# Patient Record
Sex: Female | Born: 1995
Health system: Southern US, Community
[De-identification: ages and names within clinical notes are randomized; demographics above are authoritative.]

## PROBLEM LIST (undated history)

## (undated) ENCOUNTER — Emergency Department (HOSPITAL_BASED_OUTPATIENT_CLINIC_OR_DEPARTMENT_OTHER): Admission: EM | Payer: Medicaid Other | Source: Home / Self Care

## (undated) ENCOUNTER — Emergency Department (HOSPITAL_BASED_OUTPATIENT_CLINIC_OR_DEPARTMENT_OTHER): Payer: Medicaid Other | Source: Home / Self Care

## (undated) DIAGNOSIS — F319 Bipolar disorder, unspecified: Secondary | ICD-10-CM

## (undated) DIAGNOSIS — O24419 Gestational diabetes mellitus in pregnancy, unspecified control: Secondary | ICD-10-CM

## (undated) DIAGNOSIS — F429 Obsessive-compulsive disorder, unspecified: Secondary | ICD-10-CM

## (undated) HISTORY — PX: INDUCED ABORTION: SHX677

---

## 2013-03-31 ENCOUNTER — Emergency Department (HOSPITAL_BASED_OUTPATIENT_CLINIC_OR_DEPARTMENT_OTHER)
Admission: EM | Admit: 2013-03-31 | Discharge: 2013-03-31 | Disposition: A | Discharge status: Home / Self Care | Payer: Medicaid Other | Attending: Emergency Medicine | Admitting: Emergency Medicine

## 2013-03-31 ENCOUNTER — Encounter (HOSPITAL_BASED_OUTPATIENT_CLINIC_OR_DEPARTMENT_OTHER): Payer: Self-pay | Admitting: Emergency Medicine

## 2013-03-31 ENCOUNTER — Emergency Department (HOSPITAL_BASED_OUTPATIENT_CLINIC_OR_DEPARTMENT_OTHER): Payer: Medicaid Other

## 2013-03-31 DIAGNOSIS — Z8632 Personal history of gestational diabetes: Secondary | ICD-10-CM | POA: Insufficient documentation

## 2013-03-31 DIAGNOSIS — F17200 Nicotine dependence, unspecified, uncomplicated: Secondary | ICD-10-CM | POA: Insufficient documentation

## 2013-03-31 DIAGNOSIS — R200 Anesthesia of skin: Secondary | ICD-10-CM

## 2013-03-31 DIAGNOSIS — R209 Unspecified disturbances of skin sensation: Secondary | ICD-10-CM | POA: Insufficient documentation

## 2013-03-31 HISTORY — DX: Gestational diabetes mellitus in pregnancy, unspecified control: O24.419

## 2013-03-31 LAB — CBC WITH DIFFERENTIAL/PLATELET
BASOS PCT: 0 % (ref 0–1)
Basophils Absolute: 0 10*3/uL (ref 0.0–0.1)
Eosinophils Absolute: 0.2 10*3/uL (ref 0.0–0.7)
Eosinophils Relative: 2 % (ref 0–5)
HEMATOCRIT: 38.7 % (ref 36.0–46.0)
HEMOGLOBIN: 13.2 g/dL (ref 12.0–15.0)
LYMPHS ABS: 3.2 10*3/uL (ref 0.7–4.0)
Lymphocytes Relative: 40 % (ref 12–46)
MCH: 28.4 pg (ref 26.0–34.0)
MCHC: 34.1 g/dL (ref 30.0–36.0)
MCV: 83.4 fL (ref 78.0–100.0)
MONO ABS: 0.6 10*3/uL (ref 0.1–1.0)
MONOS PCT: 8 % (ref 3–12)
NEUTROS PCT: 49 % (ref 43–77)
Neutro Abs: 3.9 10*3/uL (ref 1.7–7.7)
Platelets: 258 10*3/uL (ref 150–400)
RBC: 4.64 MIL/uL (ref 3.87–5.11)
RDW: 14.6 % (ref 11.5–15.5)
WBC: 7.9 10*3/uL (ref 4.0–10.5)

## 2013-03-31 LAB — BASIC METABOLIC PANEL
BUN: 13 mg/dL (ref 6–23)
CO2: 25 mEq/L (ref 19–32)
CREATININE: 0.7 mg/dL (ref 0.50–1.10)
Calcium: 9.8 mg/dL (ref 8.4–10.5)
Chloride: 100 mEq/L (ref 96–112)
GFR calc non Af Amer: >90 mL/min (ref >90)
GLUCOSE: 88 mg/dL (ref 70–99)
POTASSIUM: 4.1 meq/L (ref 3.7–5.3)
Sodium: 137 mEq/L (ref 137–147)

## 2013-03-31 NOTE — Discharge Instructions (Signed)
MRI as scheduled. Follow the results up with your primary Dr.  Return to the ER if your symptoms substantially worsen or change.   Paresthesia Paresthesia is an abnormal burning or prickling sensation. This sensation is generally felt in the hands, arms, legs, or feet. However, it may occur in any part of the body. It is usually not painful. The feeling may be described as:  Tingling or numbness.  "Pins and needles."  Skin crawling.  Buzzing.  Limbs "falling asleep."  Itching. Most people experience temporary (transient) paresthesia at some time in their lives. CAUSES  Paresthesia may occur when you breathe too quickly (hyperventilation). It can also occur without any apparent cause. Commonly, paresthesia occurs when pressure is placed on a nerve. The feeling quickly goes away once the pressure is removed. For some people, however, paresthesia is a long-lasting (chronic) condition caused by an underlying disorder. The underlying disorder may be:  A traumatic, direct injury to nerves. Examples include a:  Broken (fractured) neck.  Fractured skull.  A disorder affecting the brain and spinal cord (central nervous system). Examples include:  Transverse myelitis.  Encephalitis.  Transient ischemic attack.  Multiple sclerosis.  Stroke.  Tumor or blood vessel problems, such as an arteriovenous malformation pressing against the brain or spinal cord.  A condition that damages the peripheral nerves (peripheral neuropathy). Peripheral nerves are not part of the brain and spinal cord. These conditions include:  Diabetes.  Peripheral vascular disease.  Nerve entrapment syndromes, such as carpal tunnel syndrome.  Shingles.  Hypothyroidism.  Vitamin B12 deficiencies.  Alcoholism.  Heavy metal poisoning (lead, arsenic).  Rheumatoid arthritis.  Systemic lupus erythematosus. DIAGNOSIS  Your caregiver will attempt to find the underlying cause of your paresthesia. Your  caregiver may:  Take your medical history.  Perform a physical exam.  Order various lab tests.  Order imaging tests. TREATMENT  Treatment for paresthesia depends on the underlying cause. HOME CARE INSTRUCTIONS  Avoid drinking alcohol.  You may consider massage or acupuncture to help relieve your symptoms.  Keep all follow-up appointments as directed by your caregiver. SEEK IMMEDIATE MEDICAL CARE IF:   You feel weak.  You have trouble walking or moving.  You have problems with speech or vision.  You feel confused.  You cannot control your bladder or bowel movements.  You feel numbness after an injury.  You faint.  Your burning or prickling feeling gets worse when walking.  You have pain, cramps, or dizziness.  You develop a rash. MAKE SURE YOU:  Understand these instructions.  Will watch your condition.  Will get help right away if you are not doing well or get worse. Document Released: 01/17/2002 Document Revised: 04/21/2011 Document Reviewed: 10/18/2010 [facility] Patient Information 2014 [place]ExitCare, [place]LLC.

## 2013-03-31 NOTE — ED Notes (Signed)
Pt c/o numbness from waist  Down x 2 days,  Denies pain  ? Feet swelling at times

## 2013-03-31 NOTE — ED Provider Notes (Signed)
CSN: [id number]     Arrival date & time 03/31/13  1807 History   This chart was scribed for Geoffery Lyons, MD by Ladona Ridgel Day, ED scribe. This patient was seen in room MH01/MH01 and the patient's care was started at 1807.  Chief Complaint  Patient presents with  . Numbness    bilateral lower legs.   The history is provided by the patient. No language interpreter was used.   HPI Comments: Aissa Lisowski is a 18 y.o. female who presents to the Emergency Department complaining of bilateral paraesthesias over her upper lateral thighs extending down to her lower legs, onset 2 days ago; denies any recent falls or trauma. She denies previous similar episodes or hx of back issues. She reports it feels like "an epidural wearing off", she had an epidural for her pregnancy x4 months ago. She denies neck/back pain, HA, bowel or urinary incontinence. She reports normal strength and no difficulty ambulating.   No PCP  Past Medical History  Diagnosis Date  . Gestational diabetes    History reviewed. No pertinent past surgical history. No family history on file. History  Substance Use Topics  . Smoking status: Current Every Day Smoker    Types: Cigarettes  . Smokeless tobacco: Not on file  . Alcohol Use: No   OB History   Grav Para Term Preterm Abortions TAB SAB Ect Mult Living                 Review of Systems  Constitutional: Negative for fever and chills.  Respiratory: Negative for cough and shortness of breath.   Cardiovascular: Negative for chest pain.  Gastrointestinal: Negative for abdominal pain.  Musculoskeletal: Negative for back pain.  Neurological: Positive for numbness.  All other systems reviewed and are negative.   Allergies  Review of patient's allergies indicates no known allergies.  Home Medications   Current Outpatient Rx  Name  Route  Sig  Dispense  Refill  . Norethindrone (JENCYCLA PO)   Oral   Take by mouth.          Triage Vitals: BP 123/90  Pulse 69   Temp(Src) 98.9 F (37.2 C) (Oral)  Resp 18  SpO2 100%  LMP 03/15/2013  Physical Exam  Nursing note and vitals reviewed. Constitutional: She is oriented to person, place, and time. She appears well-developed and well-nourished. No distress.  HENT:  Head: Normocephalic and atraumatic.  Eyes: Conjunctivae are normal. Right eye exhibits no discharge. Left eye exhibits no discharge.  Neck: Normal range of motion.  Cardiovascular: Normal rate.   Pulmonary/Chest: Effort normal. No respiratory distress.  Musculoskeletal: Normal range of motion. She exhibits no edema.  Neurological: She is alert and oriented to person, place, and time. She exhibits normal muscle tone. Coordination normal.  Gait normal with no abnormalities. Normal motor strength and sensation intact. DTRs are 1+ and equal. She is able to walk on heels and toes w/out difficulty.   Skin: Skin is warm and dry.  Psychiatric: She has a normal mood and affect. Thought content normal.   ED Course  Procedures (including critical care time) DIAGNOSTIC STUDIES: Oxygen Saturation is 100% on room air, normal by my interpretation.    COORDINATION OF CARE: At 21 PM Discussed treatment plan with patient which includes lumbar spine X-ray, blood work. Patient agrees.   Labs Review Labs Reviewed  CBC WITH DIFFERENTIAL  BASIC METABOLIC PANEL   Imaging Review No results found.   MDM   Final diagnoses:  None  Patient is a 18 year old female who presents with complaints of numbness in her legs. She has no bowel or bladder complaints and denies any injury or trauma. She has a history of multiple sclerosis within her family and is concerned that this could be the problem. Workup reveals normal labs and lumbar spine films. Due to the nature of complaints and her family history I will order MRI of the brain, cervical spine, and lumbar spine. This will be performed as an outpatient as there is no MRI here this evening to perform  this.   I personally performed the services described in this documentation, which was scribed in my presence. The recorded information has been reviewed and is accurate.     Geoffery Lyonsouglas Fabiha Rougeau, MD 03/31/13 2100

## 2013-03-31 NOTE — ED Notes (Signed)
Patient states she has bilateral lower leg numbness for the last two days.  Denies any injury.  Pt ambulates with no problem or distress and was able to work today with no problems.

## 2013-04-02 NOTE — ED Provider Notes (Signed)
8:53 AM Patient presents for outpt MRI ordered by Dr. Judd Lienelo several days ago d/t paresthesias in LE's. We have MRI here now, but orders not put in the system correctly for some reason and pt will have to check in for me to put the orders back in. She does not want to wait and would prefer to get the MRI on Monday. I will place the orders for the MRI so she can get pre-certified (by her insurance). She is happy with this. This was arranged by the radiology techs and I did not meet or examine the patient.   Junius ArgyleForrest S Lorenda Grecco, MD 04/02/13 [phone]

## 2013-12-14 ENCOUNTER — Emergency Department (HOSPITAL_BASED_OUTPATIENT_CLINIC_OR_DEPARTMENT_OTHER)
Admission: EM | Admit: 2013-12-14 | Discharge: 2013-12-14 | Disposition: A | Discharge status: Home / Self Care | Payer: Medicaid Other | Attending: Emergency Medicine | Admitting: Emergency Medicine

## 2013-12-14 ENCOUNTER — Emergency Department (HOSPITAL_BASED_OUTPATIENT_CLINIC_OR_DEPARTMENT_OTHER): Payer: Medicaid Other

## 2013-12-14 ENCOUNTER — Encounter (HOSPITAL_BASED_OUTPATIENT_CLINIC_OR_DEPARTMENT_OTHER): Payer: Self-pay

## 2013-12-14 DIAGNOSIS — N390 Urinary tract infection, site not specified: Secondary | ICD-10-CM

## 2013-12-14 DIAGNOSIS — F319 Bipolar disorder, unspecified: Secondary | ICD-10-CM | POA: Diagnosis not present

## 2013-12-14 DIAGNOSIS — R0789 Other chest pain: Secondary | ICD-10-CM | POA: Diagnosis not present

## 2013-12-14 DIAGNOSIS — Z720 Tobacco use: Secondary | ICD-10-CM | POA: Insufficient documentation

## 2013-12-14 DIAGNOSIS — R0602 Shortness of breath: Secondary | ICD-10-CM | POA: Diagnosis not present

## 2013-12-14 DIAGNOSIS — Z8632 Personal history of gestational diabetes: Secondary | ICD-10-CM | POA: Diagnosis not present

## 2013-12-14 DIAGNOSIS — Z3202 Encounter for pregnancy test, result negative: Secondary | ICD-10-CM | POA: Insufficient documentation

## 2013-12-14 DIAGNOSIS — R079 Chest pain, unspecified: Secondary | ICD-10-CM | POA: Diagnosis present

## 2013-12-14 HISTORY — DX: Bipolar disorder, unspecified: F31.9

## 2013-12-14 HISTORY — DX: Obsessive-compulsive disorder, unspecified: F42.9

## 2013-12-14 LAB — BASIC METABOLIC PANEL
Anion gap: 12 (ref 5–15)
BUN: 11 mg/dL (ref 6–23)
CO2: 25 meq/L (ref 19–32)
CREATININE: 0.7 mg/dL (ref 0.50–1.10)
Calcium: 9.4 mg/dL (ref 8.4–10.5)
Chloride: 103 mEq/L (ref 96–112)
GFR calc Af Amer: >90 mL/min (ref >90)
GFR calc non Af Amer: >90 mL/min (ref >90)
Glucose, Bld: 91 mg/dL (ref 70–99)
Potassium: 3.8 mEq/L (ref 3.7–5.3)
SODIUM: 140 meq/L (ref 137–147)

## 2013-12-14 LAB — CBC WITH DIFFERENTIAL/PLATELET
Basophils Absolute: 0 10*3/uL (ref 0.0–0.1)
Basophils Relative: 0 % (ref 0–1)
EOS PCT: 2 % (ref 0–5)
Eosinophils Absolute: 0.1 10*3/uL (ref 0.0–0.7)
HEMATOCRIT: 39.7 % (ref 36.0–46.0)
Hemoglobin: 13.5 g/dL (ref 12.0–15.0)
LYMPHS ABS: 3.4 10*3/uL (ref 0.7–4.0)
LYMPHS PCT: 56 % — AB (ref 12–46)
MCH: 28.7 pg (ref 26.0–34.0)
MCHC: 34 g/dL (ref 30.0–36.0)
MCV: 84.3 fL (ref 78.0–100.0)
MONO ABS: 0.6 10*3/uL (ref 0.1–1.0)
MONOS PCT: 10 % (ref 3–12)
Neutro Abs: 1.9 10*3/uL (ref 1.7–7.7)
Neutrophils Relative %: 32 % — ABNORMAL LOW (ref 43–77)
Platelets: 268 10*3/uL (ref 150–400)
RBC: 4.71 MIL/uL (ref 3.87–5.11)
RDW: 14.2 % (ref 11.5–15.5)
WBC: 6.1 10*3/uL (ref 4.0–10.5)

## 2013-12-14 LAB — URINALYSIS, ROUTINE W REFLEX MICROSCOPIC
Glucose, UA: NEGATIVE mg/dL
KETONES UR: 15 mg/dL — AB
Nitrite: POSITIVE — AB
Protein, ur: NEGATIVE mg/dL
SPECIFIC GRAVITY, URINE: 1.035 — AB (ref 1.005–1.030)
Urobilinogen, UA: 1 mg/dL (ref 0.0–1.0)
pH: 6 (ref 5.0–8.0)

## 2013-12-14 LAB — URINE MICROSCOPIC-ADD ON

## 2013-12-14 LAB — PREGNANCY, URINE: Preg Test, Ur: NEGATIVE

## 2013-12-14 LAB — D-DIMER, QUANTITATIVE (NOT AT ARMC)

## 2013-12-14 MED ORDER — CEPHALEXIN 500 MG PO CAPS: 500.0000 mg | ORAL_CAPSULE | Freq: Two times a day (BID) | ORAL | Status: DC

## 2013-12-14 NOTE — Discharge Instructions (Signed)
Chest Pain (Nonspecific) It is often hard to give a diagnosis for the cause of chest pain. There is always a chance that your pain could be related to something serious, such as a heart attack or a blood clot in the lungs. You need to follow up with your doctor. HOME CARE  If antibiotic medicine was given, take it as directed by your doctor. Finish the medicine even if you start to feel better.  For the next few days, avoid activities that bring on chest pain. Continue physical activities as told by your doctor.  Do not use any tobacco products. This includes cigarettes, chewing tobacco, and e-cigarettes.  Avoid drinking alcohol.  Only take medicine as told by your doctor.  Follow your doctor's suggestions for more testing if your chest pain does not go away.  Keep all doctor visits you made. GET HELP IF:  Your chest pain does not go away, even after treatment.  You have a rash with blisters on your chest.  You have a fever. GET HELP RIGHT AWAY IF:   You have more pain or pain that spreads to your arm, neck, jaw, back, or belly (abdomen).  You have shortness of breath.  You cough more than usual or cough up blood.  You have very bad back or belly pain.  You feel sick to your stomach (nauseous) or throw up (vomit).  You have very bad weakness.  You pass out (faint).  You have chills. This is an emergency. Do not wait to see if the problems will go away. Call your local emergency services (911 in [place] Do not drive yourself to the hospital. MAKE SURE YOU:   Understand these instructions.  Will watch your condition.  Will get help right away if you are not doing well or get worse. Document Released: 07/16/2007 Document Revised: 02/01/2013 Document Reviewed: 07/16/2007 [facility] Patient Information 2015 [place]ExitCare, [place]LLC. This information is not intended to replace advice given to you by your health care provider. Make sure you discuss any questions you have with your  health care provider.  Urinary Tract Infection Urinary tract infections (UTIs) can develop anywhere along your urinary tract. Your urinary tract is your body's drainage system for removing wastes and extra water. Your urinary tract includes two kidneys, two ureters, a bladder, and a urethra. Your kidneys are a pair of bean-shaped organs. Each kidney is about the size of your fist. They are located below your ribs, one on each side of your spine. CAUSES Infections are caused by microbes, which are microscopic organisms, including fungi, viruses, and bacteria. These organisms are so small that they can only be seen through a microscope. Bacteria are the microbes that most commonly cause UTIs. SYMPTOMS  Symptoms of UTIs may vary by age and gender of the patient and by the location of the infection. Symptoms in young women typically include a frequent and intense urge to urinate and a painful, burning feeling in the bladder or urethra during urination. Older women and men are more likely to be tired, shaky, and weak and have muscle aches and abdominal pain. A fever may mean the infection is in your kidneys. Other symptoms of a kidney infection include pain in your back or sides below the ribs, nausea, and vomiting. DIAGNOSIS To diagnose a UTI, your caregiver will ask you about your symptoms. Your caregiver also will ask to provide a urine sample. The urine sample will be tested for bacteria and white blood cells. White blood cells are made  by your body to help fight infection. TREATMENT  Typically, UTIs can be treated with medication. Because most UTIs are caused by a bacterial infection, they usually can be treated with the use of antibiotics. The choice of antibiotic and length of treatment depend on your symptoms and the type of bacteria causing your infection. HOME CARE INSTRUCTIONS  If you were prescribed antibiotics, take them exactly as your caregiver instructs you. Finish the medication even if you  feel better after you have only taken some of the medication.  Drink enough water and fluids to keep your urine clear or pale yellow.  Avoid caffeine, tea, and carbonated beverages. They tend to irritate your bladder.  Empty your bladder often. Avoid holding urine for long periods of time.  Empty your bladder before and after sexual intercourse.  After a bowel movement, women should cleanse from front to back. Use each tissue only once. SEEK MEDICAL CARE IF:   You have back pain.  You develop a fever.  Your symptoms do not begin to resolve within 3 days. SEEK IMMEDIATE MEDICAL CARE IF:   You have severe back pain or lower abdominal pain.  You develop chills.  You have nausea or vomiting.  You have continued burning or discomfort with urination. MAKE SURE YOU:   Understand these instructions.  Will watch your condition.  Will get help right away if you are not doing well or get worse. Document Released: 11/06/2004 Document Revised: 07/29/2011 Document Reviewed: 03/07/2011 [facility]Exit[facility] Patient Information 2015 [place]ExitCare, [place]LLC. This information is not intended to replace advice given to you by your health care provider. Make sure you discuss any questions you have with your health care provider.

## 2013-12-14 NOTE — ED Notes (Signed)
C/o intermittent CP x 1 month- and "my pee smells like lemons"

## 2013-12-14 NOTE — ED Provider Notes (Signed)
CSN: [id number]     Arrival date & time 12/14/13  1414 History   First MD Initiated Contact with Patient 12/14/13 1421     Chief Complaint  Patient presents with  . Chest Pain     (Consider location/radiation/quality/duration/timing/severity/associated sxs/prior Treatment) HPI Comments: Pt states that she has had an intermittently "heaviness" in her chest. She states that she feels sob and that is what causes the heaviness. Nothing makes that pain better or worse. No cough, fever, n/v, dizziness, or swelling. Hasn't tried anything for the symptoms. Does take birthcontrol pill. Also notes that her urine smells like lemons  The history is provided by the patient. No language interpreter was used.    Past Medical History  Diagnosis Date  . Gestational diabetes   . Bipolar disorder   . OCD (obsessive compulsive disorder)    History reviewed. No pertinent past surgical history. No family history on file. History  Substance Use Topics  . Smoking status: Current Every Day Smoker    Types: Cigarettes  . Smokeless tobacco: Not on file  . Alcohol Use: Yes   OB History    No data available     Review of Systems  All other systems reviewed and are negative.     Allergies  Review of patient's allergies indicates no known allergies.  Home Medications   Prior to Admission medications   Medication Sig Start Date End Date Taking? Authorizing Provider  OXCARBAZEPINE PO Take by mouth.   Yes Historical Provider, MD  Sertraline HCl (ZOLOFT PO) Take by mouth.   Yes Historical Provider, MD  Norethindrone (JENCYCLA PO) Take by mouth.    Historical Provider, MD   BP 128/88 mmHg  Pulse 67  Temp(Src) 98.7 F (37.1 C) (Oral)  Resp 18  Ht 5\' 5"  (1.651 m)  Wt 165 lb (74.844 kg)  BMI 27.46 kg/m2  SpO2 100%  LMP 12/11/2013 Physical Exam  Constitutional: She is oriented to person, place, and time. She appears well-developed and well-nourished.  HENT:  Head: Normocephalic and  atraumatic.  Cardiovascular: Normal rate and regular rhythm.   Pulmonary/Chest: Effort normal and breath sounds normal.  Chest tender to palpation on the left chest wall  Abdominal: Soft. Bowel sounds are normal. There is no tenderness.  Musculoskeletal: Normal range of motion.  Neurological: She is alert and oriented to person, place, and time.  Skin: Skin is warm and dry.  Psychiatric: She has a normal mood and affect.  Nursing note and vitals reviewed.   ED Course  Procedures (including critical care time) Labs Review Labs Reviewed  URINALYSIS, ROUTINE W REFLEX MICROSCOPIC - Abnormal; Notable for the following:    Color, Urine AMBER (*)    APPearance CLOUDY (*)    Specific Gravity, Urine 1.035 (*)    Hgb urine dipstick MODERATE (*)    Bilirubin Urine SMALL (*)    Ketones, ur 15 (*)    Nitrite POSITIVE (*)    Leukocytes, UA SMALL (*)    All other components within normal limits  CBC WITH DIFFERENTIAL - Abnormal; Notable for the following:    Neutrophils Relative % 32 (*)    Lymphocytes Relative 56 (*)    All other components within normal limits  URINE MICROSCOPIC-ADD ON - Abnormal; Notable for the following:    Squamous Epithelial / LPF FEW (*)    Bacteria, UA MANY (*)    All other components within normal limits  PREGNANCY, URINE  BASIC METABOLIC PANEL  D-DIMER, QUANTITATIVE  Imaging Review No results found.   EKG Interpretation   Date/Time:  Wednesday December 14 2013 14:31:18 EST Ventricular Rate:  68 PR Interval:  154 QRS Duration: 84 QT Interval:  404 QTC Calculation: 429 R Axis:   85 Text Interpretation:  Normal sinus rhythm with sinus arrhythmia Normal ECG  Confirmed by WARD,  DO, KRISTEN ([id number]) on 12/14/2013 2:33:30 PM      MDM   Final diagnoses:  SOB (shortness of breath)  Other chest pain  UTI (lower urinary tract infection)    Will treat pt for simple uti. Pt is not pregnant ad not having fever. Doubt acs for cp. D dimer is  negative.considered pe as pt is on birth control pills    Teressa LowerVrinda Peighton Mehra, NP 12/14/13 1608  Layla MawKristen N Ward, DO 12/15/13 [phone]

## 2013-12-27 ENCOUNTER — Ambulatory Visit: Payer: Medicaid Other | Admitting: Cardiology

## 2015-05-17 ENCOUNTER — Emergency Department (HOSPITAL_BASED_OUTPATIENT_CLINIC_OR_DEPARTMENT_OTHER)
Admission: EM | Admit: 2015-05-17 | Discharge: 2015-05-17 | Disposition: A | Discharge status: Home / Self Care | Payer: Medicaid Other | Attending: Emergency Medicine | Admitting: Emergency Medicine

## 2015-05-17 ENCOUNTER — Encounter (HOSPITAL_BASED_OUTPATIENT_CLINIC_OR_DEPARTMENT_OTHER): Payer: Self-pay | Admitting: Emergency Medicine

## 2015-05-17 DIAGNOSIS — Z8659 Personal history of other mental and behavioral disorders: Secondary | ICD-10-CM | POA: Insufficient documentation

## 2015-05-17 DIAGNOSIS — Z8632 Personal history of gestational diabetes: Secondary | ICD-10-CM | POA: Insufficient documentation

## 2015-05-17 DIAGNOSIS — F17210 Nicotine dependence, cigarettes, uncomplicated: Secondary | ICD-10-CM | POA: Insufficient documentation

## 2015-05-17 DIAGNOSIS — L739 Follicular disorder, unspecified: Secondary | ICD-10-CM | POA: Insufficient documentation

## 2015-05-17 MED ORDER — IBUPROFEN 800 MG PO TABS: 800.0000 mg | ORAL_TABLET | Freq: Three times a day (TID) | ORAL | Status: DC

## 2015-05-17 MED ORDER — CLINDAMYCIN PHOSPHATE 1 % EX SOLN: Freq: Two times a day (BID) | CUTANEOUS | Status: DC

## 2015-05-17 NOTE — ED Notes (Signed)
Pt on phone,texting, during entire interview

## 2015-05-17 NOTE — ED Notes (Signed)
PA at bedside.

## 2015-05-17 NOTE — Discharge Instructions (Signed)
Folliculitis °Folliculitis is redness, soreness, and swelling (inflammation) of the hair follicles. This condition can occur anywhere on the body. People with weakened immune systems, diabetes, or obesity have a greater risk of getting folliculitis. °CAUSES °· Bacterial infection. This is the most common cause. °· Fungal infection. °· Viral infection. °· Contact with certain chemicals, especially oils and tars. °Long-term folliculitis can result from bacteria that live in the nostrils. The bacteria may trigger multiple outbreaks of folliculitis over time. °SYMPTOMS °Folliculitis most commonly occurs on the scalp, thighs, legs, back, buttocks, and areas where hair is shaved frequently. An early sign of folliculitis is a small, white or yellow, pus-filled, itchy lesion (pustule). These lesions appear on a red, inflamed follicle. They are usually less than 0.2 inches (5 mm) wide. When there is an infection of the follicle that goes deeper, it becomes a boil or furuncle. A group of closely packed boils creates a larger lesion (carbuncle). Carbuncles tend to occur in hairy, sweaty areas of the body. °DIAGNOSIS  °Your caregiver can usually tell what is wrong by doing a physical exam. A sample may be taken from one of the lesions and tested in a lab. This can help determine what is causing your folliculitis. °TREATMENT  °Treatment may include: °· Applying warm compresses to the affected areas. °· Taking antibiotic medicines orally or applying them to the skin. °· Draining the lesions if they contain a large amount of pus or fluid. °· Laser hair removal for cases of long-lasting folliculitis. This helps to prevent regrowth of the hair. °HOME CARE INSTRUCTIONS °· Apply warm compresses to the affected areas as directed by your caregiver. °· If antibiotics are prescribed, take them as directed. Finish them even if you start to feel better. °· You may take over-the-counter medicines to relieve itching. °· Do not shave irritated  skin. °· Follow up with your caregiver as directed. °SEEK IMMEDIATE MEDICAL CARE IF:  °· You have increasing redness, swelling, or pain in the affected area. °· You have a fever. °MAKE SURE YOU: °· Understand these instructions. °· Will watch your condition. °· Will get help right away if you are not doing well or get worse. °  °This information is not intended to replace advice given to you by your health care provider. Make sure you discuss any questions you have with your health care provider. °  °Document Released: 04/07/2001 Document Revised: 02/17/2014 Document Reviewed: 04/29/2011 °[employer]vier Interactive Patient Education ©2016 [employer] ° °

## 2015-05-17 NOTE — ED Provider Notes (Signed)
CSN: [id number]     Arrival date & time 05/17/15  4098 History   First MD Initiated Contact with Patient 05/17/15 1010     Chief Complaint  Patient presents with  . Abscess     (Consider location/radiation/quality/duration/timing/severity/associated sxs/prior Treatment) Patient is a 20 y.o. female presenting with abscess. The history is provided by the patient.  Abscess Abscess location: right outer labia. Abscess quality: painful and redness   Abscess quality: not draining, no fluctuance, no warmth and not weeping   Duration:  5 days Progression:  Improving Pain details:    Quality:  Aching   Severity:  Mild Chronicity:  Recurrent Relieved by:  Warm compresses and warm water soaks Worsened by:  Nothing tried Ineffective treatments:  None tried Associated symptoms: no anorexia, no fatigue, no fever, no nausea and no vomiting   Risk factors: prior abscess     Past Medical History  Diagnosis Date  . Gestational diabetes   . Bipolar disorder (HCC)   . OCD (obsessive compulsive disorder)    History reviewed. No pertinent past surgical history. No family history on file. Social History  Substance Use Topics  . Smoking status: Current Every Day Smoker    Types: Cigarettes  . Smokeless tobacco: None  . Alcohol Use: Yes   OB History    No data available     Review of Systems  Constitutional: Negative for fever, chills and fatigue.  Gastrointestinal: Negative for nausea, vomiting, abdominal pain and anorexia.  Genitourinary: Negative for dysuria, urgency, frequency, hematuria, vaginal bleeding, vaginal discharge, genital sores, vaginal pain and pelvic pain.  All other systems reviewed and are negative.     Allergies  Review of patient's allergies indicates no known allergies.  Home Medications   Prior to Admission medications   Not on File   BP 125/85 mmHg  Pulse 76  Temp(Src) 97.8 F (36.6 C) (Oral)  Resp 16  SpO2 100% Physical Exam  Constitutional: She  is oriented to person, place, and time. She appears well-developed and well-nourished.  Non-toxic appearance. She does not have a sickly appearance. She does not appear ill.  HENT:  Head: Normocephalic and atraumatic.  Mouth/Throat: Oropharynx is clear and moist.  Eyes: Conjunctivae are normal. Pupils are equal, round, and reactive to light.  Neck: Normal range of motion. Neck supple.  Cardiovascular: Normal rate, regular rhythm and normal heart sounds.   No murmur heard. Pulmonary/Chest: Effort normal and breath sounds normal. No accessory muscle usage or stridor. No respiratory distress. She has no wheezes. She has no rhonchi. She has no rales.  Abdominal: Soft. Bowel sounds are normal. She exhibits no distension. There is no tenderness.  Genitourinary: No vaginal discharge found.  Musculoskeletal: Normal range of motion.  Lymphadenopathy:    She has no cervical adenopathy.  Neurological: She is alert and oriented to person, place, and time.  Speech clear without dysarthria.  Skin: Skin is warm and dry.  Small area of erythema and induration on mid-right labia c/w folliculitis.  No fluctuance.  Psychiatric: She has a normal mood and affect. Her behavior is normal.    ED Course  Procedures (including critical care time) Labs Review Labs Reviewed - No data to display  Imaging Review No results found. I have personally reviewed and evaluated these images and lab results as part of my medical decision-making.   EKG Interpretation None      MDM   Final diagnoses:  Folliculitis   Patient presents with left labia area of  erythema and induration c/w folliculitis.  No fluctuance.  Afebrile, well-appearing.  Offered I&D procedure, and patient declined.  No systemic symptoms, vitals reassuring.  Doubt systemic infection. Plant to discharge home with topical clindamycin.  Ibuprofen for pain. Recommend continued warm soaks and compresses.  Discussed return precautions including worsening  pain, redness, increasing size, fever, chills, nausea or vomiting.  Patient agrees and acknowledges the above plan for discharge.     Cheri FowlerKayla Creedon Danielski, PA-C 05/17/15 1042  Doug SouSam Jacubowitz, MD 05/17/15 [phone]

## 2015-05-17 NOTE — ED Notes (Signed)
Abscess

## 2015-05-21 ENCOUNTER — Emergency Department (HOSPITAL_BASED_OUTPATIENT_CLINIC_OR_DEPARTMENT_OTHER)
Admission: EM | Admit: 2015-05-21 | Discharge: 2015-05-21 | Disposition: A | Discharge status: Home / Self Care | Payer: Medicaid Other | Attending: Emergency Medicine | Admitting: Emergency Medicine

## 2015-05-21 ENCOUNTER — Encounter (HOSPITAL_BASED_OUTPATIENT_CLINIC_OR_DEPARTMENT_OTHER): Payer: Self-pay | Admitting: Emergency Medicine

## 2015-05-21 DIAGNOSIS — F319 Bipolar disorder, unspecified: Secondary | ICD-10-CM | POA: Insufficient documentation

## 2015-05-21 DIAGNOSIS — K0510 Chronic gingivitis, plaque induced: Secondary | ICD-10-CM | POA: Insufficient documentation

## 2015-05-21 DIAGNOSIS — F17210 Nicotine dependence, cigarettes, uncomplicated: Secondary | ICD-10-CM | POA: Insufficient documentation

## 2015-05-21 DIAGNOSIS — K0889 Other specified disorders of teeth and supporting structures: Secondary | ICD-10-CM | POA: Insufficient documentation

## 2015-05-21 MED ORDER — AMOXICILLIN 500 MG PO CAPS: 500.0000 mg | ORAL_CAPSULE | Freq: Two times a day (BID) | ORAL | Status: DC

## 2015-05-21 NOTE — Discharge Instructions (Signed)
You were seen and evaluated today for your dental pain. Make an appointment to follow up with a dentist today. Take the amoxicillin to prevent infection. Continue to use ibuprofen for pain control.  Dental Pain Dental pain may be caused by many things, including:  Tooth decay (cavities or caries). Cavities expose the nerve of your tooth to air and hot or cold temperatures. This can cause pain or discomfort.  Abscess or infection. A dental abscess is a collection of infected pus from a bacterial infection in the inner part of the tooth (pulp). It usually occurs at the end of the tooth's root.  Injury.  An unknown reason (idiopathic). Your pain may be mild or severe. It may only occur when:  You are chewing.  You are exposed to hot or cold temperature.  You are eating or drinking sugary foods or beverages, such as soda or candy. Your pain may also be constant. HOME CARE INSTRUCTIONS Watch your dental pain for any changes. The following actions may help to lessen any discomfort that you are feeling:  Take medicines only as directed by your dentist.  If you were prescribed an antibiotic medicine, finish all of it even if you start to feel better.  Keep all follow-up visits as directed by your dentist. This is important.  Do not apply heat to the outside of your face.  Rinse your mouth or gargle with salt water if directed by your dentist. This helps with pain and swelling.  You can make salt water by adding  tsp of salt to 1 cup of warm water.  Apply ice to the painful area of your face:  Put ice in a plastic bag.  Place a towel between your skin and the bag.  Leave the ice on for 20 minutes, 2-3 times per day.  Avoid foods or drinks that cause you pain, such as:  Very hot or very cold foods or drinks.  Sweet or sugary foods or drinks. SEEK MEDICAL CARE IF:  Your pain is not controlled with medicines.  Your symptoms are worse.  You have new symptoms. SEEK IMMEDIATE  MEDICAL CARE IF:  You are unable to open your mouth.  You are having trouble breathing or swallowing.  You have a fever.  Your face, neck, or jaw is swollen.   This information is not intended to replace advice given to you by your health care provider. Make sure you discuss any questions you have with your health care provider.   Document Released: 01/27/2005 Document Revised: 06/13/2014 Document Reviewed: 01/23/2014 Elsevier Interactive Patient Education 2016 [employer]   [employer] The United [employer] 211 is a great source of information about community services available.  Access by dialing 2-1-1 from anywhere in [place], or by website -  PooledIncome.plwww.nc211.org.   Other Local Resources (Updated 02/2015)  Dental  Care   Services    Phone Number and Address  Cost  [place] [facility] For children 790 - 22 years of age:   Cleaning  Tooth brushing/flossing instruction  Sealants, fillings, crowns  Extractions  Emergency treatment  [phone] [address] [place], [place] [phone] Charges based on family income.  Medicaid and some insurance plans accepted.     [facility]  Cleaning  Sealants, fillings, crowns  Extractions  Emergency treatment [phone] [address] [place], [place]  Pregnant women 22 years of age or older with a Medicaid card  [facility] [place]  Cleaning  Sealants, fillings, crowns  Extractions  Emergency treatment [phone] [address] [place], [place] Pregnant women 73 years of age or older with a Medicaid card  [facility] Department of Health - [facility] For children 93 - 48 years of age:   Cleaning  Tooth brushing/flossing instruction  Sealants, fillings, crowns  Extractions  Emergency treatment Limited orthodontic services for patients with Medicaid  [phone] [address] [place], [place] [address] Medicaid and [facility] Health Choice cover for children up to age 88 and pregnant women.  Parents of children up to age 52 without Medicaid pay a reduced fee at time of service.  [facility] Department of [employer] For children 61 - 19 years of age:   Cleaning  Tooth brushing/flossing instruction  Sealants, fillings, crowns  Extractions  Emergency treatment Limited orthodontic services for patients with Medicaid [phone] [address] [place], [place].  Medicaid and [place] Health Choice cover for children up to age 19 and pregnant women.  Parents of children up to age 36 without Medicaid pay a reduced fee.  [facility]  Sealants, fillings, crowns  Extractions  Hours: Tuesdays and Thursdays, 4:15 - 8 pm [phone] [address] [place], [place] [address] Services free of charge to [facility] residents ages 18-64 who do not have health insurance, Medicare, [place], or [place] benefits and fall within federal poverty guidelines  [employer]    Provides dental care in addition to primary medical care, nutritional counseling, and pharmacy:  Nurse, mental health, fillings, crowns  Extractions                  [phone] [facility], [address] [place], [place]  [phone] [facility], [address] [place], [place]  [phone] [facility] [place], [place]  [phone] [facility] [address] [place], [place]  [phone] [facility] [address] [place], [place] Accepts [place], [place], most insurance.  Also provides services available to all with fees adjusted based on ability to pay.    [facility]  Cleaning  Tooth brushing/flossing  instruction  Sealants, fillings, crowns  Extractions  Emergency treatment Hours: Tuesdays, Thursdays, and Fridays from 8 am to 5 pm by appointment only. [phone] [address] [place], [place] [address] [facility] residents with Medicaid (depending on eligibility) and children with [facility] Health Choice - call for more information.  [facility]  Extractions only  Hours: 2nd and 4th Thursday of each month from 6:30 am - 9 am.   [phone] ext. [phone] [place], [place] [address] Ages 22 and older only.  Patients are seen on a first come, first served basis.  [employer]  [employer]  Extractions  Orthodontics  Endodontics  Implants/Crowns/Bridges  Complete and partial dentures [phone] [place], [place] Patients must complete an application for services.  There is often a waiting list.

## 2015-05-21 NOTE — ED Notes (Signed)
Dental problem x severa days

## 2015-05-21 NOTE — ED Provider Notes (Signed)
CSN: [id number]     Arrival date & time 05/21/15  [id number] History   First MD Initiated Contact with Patient 05/21/15 0701     Chief Complaint  Patient presents with  . Dental Pain     (Consider location/radiation/quality/duration/timing/severity/associated sxs/prior Treatment) HPI Comments: 20 year old female with no significant past medical history presents for dental pain.  The patient reports that the pain started two days ago.  She says that she knows she has a wisdom tooth coming in that is not coming in correctly and is causing pain.  She denies fever, chills, nausea, vomiting, neck pain, ear pain.  She reports that she brushes her teeth extremely hard and the bristles of her tooth brush are always turned out after a few weeks of use.  Patient reports he has been using Motrin without relief.  Patient says that she wants to get the tooth out today and needed a work note for her job.     Past Medical History  Diagnosis Date  . Gestational diabetes   . Bipolar disorder (HCC)   . OCD (obsessive compulsive disorder)    History reviewed. No pertinent past surgical history. No family history on file. Social History  Substance Use Topics  . Smoking status: Current Every Day Smoker    Types: Cigarettes  . Smokeless tobacco: None  . Alcohol Use: Yes   OB History    No data available     Review of Systems  Constitutional: Negative for fever and chills.  HENT: Positive for dental problem (pain). Negative for congestion, ear pain, postnasal drip, rhinorrhea, sore throat, tinnitus, trouble swallowing and voice change.   Eyes: Negative for pain and redness.  Respiratory: Negative for cough, shortness of breath, wheezing and stridor.   Cardiovascular: Negative for palpitations.  Gastrointestinal: Negative for nausea, vomiting and diarrhea.  Genitourinary: Negative for dysuria, urgency and frequency.  Musculoskeletal: Negative for myalgias, neck pain and neck stiffness.  Skin: Negative for  rash and wound.  Neurological: Negative for speech difficulty and headaches.  Hematological: Negative for adenopathy.      Allergies  Review of patient's allergies indicates no known allergies.  Home Medications   Prior to Admission medications   Medication Sig Start Date End Date Taking? Authorizing Provider  amoxicillin (AMOXIL) 500 MG capsule Take 1 capsule (500 mg total) by mouth 2 (two) times daily. 05/21/15   Leta BaptistEmily Roe Kamoni Depree, MD  ibuprofen (ADVIL,MOTRIN) 800 MG tablet Take 1 tablet (800 mg total) by mouth 3 (three) times daily. 05/17/15   Kayla Rose, PA-C   BP 112/75 mmHg  Pulse 74  Temp(Src) 98.3 F (36.8 C) (Oral)  Resp 16  Ht 5\' 5"  (1.651 m)  Wt 160 lb (72.576 kg)  BMI 26.63 kg/m2  SpO2 99% Physical Exam  Constitutional: She is oriented to person, place, and time. She appears well-developed and well-nourished. No distress.  HENT:  Head: Normocephalic and atraumatic.  Right Ear: External ear normal.  Left Ear: External ear normal.  Nose: Nose normal.  Mouth/Throat: Oropharynx is clear and moist. Mucous membranes are not dry. She does not have dentures. No oral lesions. No trismus in the jaw. Abnormal dentition (exposure of the base of the teeth in the left lower teeth with sensitivity over the bases). No dental abscesses, uvula swelling, lacerations or dental caries. No oropharyngeal exudate, posterior oropharyngeal edema, posterior oropharyngeal erythema or tonsillar abscesses.  Mild gingivitis  Eyes: EOM are normal. Pupils are equal, round, and reactive to light.  Neck: Normal range  of motion and full passive range of motion without pain. Neck supple. No spinous process tenderness and no muscular tenderness present. No rigidity. Normal range of motion present.  Cardiovascular: Normal rate, regular rhythm, normal heart sounds and intact distal pulses.   No murmur heard. Pulmonary/Chest: Effort normal. No respiratory distress. She has no wheezes. She has no rales.   Abdominal: Soft. She exhibits no distension. There is no tenderness.  Musculoskeletal: Normal range of motion. She exhibits no edema or tenderness.  Neurological: She is alert and oriented to person, place, and time.  Skin: Skin is warm and dry. No rash noted. She is not diaphoretic.  Vitals reviewed.   ED Course  Procedures (including critical care time) Labs Review Labs Reviewed - No data to display  Imaging Review No results found. I have personally reviewed and evaluated these images and lab results as part of my medical decision-making.   EKG Interpretation None      MDM  Patient seen and evaluated in stable condition.  Non toxic.  No sign of abscess.  Exposure of base of teeth, mild gingivitis.  Patient instructed on need for dental follow up.  Resources given.  Patient given Amoxicillin for infection control.  Patient expressed understanding and agreement with plan for discharge and outpatient follow up.   Final diagnoses:  Pain, dental    1. Dental pain  2. Mild gingivitis    Leta Baptist, MD 05/21/15 [phone]

## 2015-05-30 ENCOUNTER — Emergency Department (HOSPITAL_BASED_OUTPATIENT_CLINIC_OR_DEPARTMENT_OTHER): Payer: Medicaid Other

## 2015-05-30 ENCOUNTER — Emergency Department (HOSPITAL_BASED_OUTPATIENT_CLINIC_OR_DEPARTMENT_OTHER)
Admission: EM | Admit: 2015-05-30 | Discharge: 2015-05-30 | Discharge status: Against Medical Advice | Payer: Medicaid Other | Attending: Emergency Medicine | Admitting: Emergency Medicine

## 2015-05-30 ENCOUNTER — Encounter (HOSPITAL_BASED_OUTPATIENT_CLINIC_OR_DEPARTMENT_OTHER): Payer: Self-pay

## 2015-05-30 DIAGNOSIS — Z8632 Personal history of gestational diabetes: Secondary | ICD-10-CM | POA: Insufficient documentation

## 2015-05-30 DIAGNOSIS — R102 Pelvic and perineal pain: Secondary | ICD-10-CM | POA: Insufficient documentation

## 2015-05-30 DIAGNOSIS — Z8659 Personal history of other mental and behavioral disorders: Secondary | ICD-10-CM | POA: Insufficient documentation

## 2015-05-30 DIAGNOSIS — R110 Nausea: Secondary | ICD-10-CM | POA: Insufficient documentation

## 2015-05-30 DIAGNOSIS — Z3202 Encounter for pregnancy test, result negative: Secondary | ICD-10-CM | POA: Insufficient documentation

## 2015-05-30 LAB — URINALYSIS, ROUTINE W REFLEX MICROSCOPIC
Bilirubin Urine: NEGATIVE
GLUCOSE, UA: NEGATIVE mg/dL
Hgb urine dipstick: NEGATIVE
Ketones, ur: NEGATIVE mg/dL
LEUKOCYTES UA: NEGATIVE
Nitrite: NEGATIVE
PH: 6.5 (ref 5.0–8.0)
PROTEIN: NEGATIVE mg/dL
Specific Gravity, Urine: 1.006 (ref 1.005–1.030)

## 2015-05-30 LAB — WET PREP, GENITAL
CLUE CELLS WET PREP: NONE SEEN
SPERM: NONE SEEN
Trich, Wet Prep: NONE SEEN
Yeast Wet Prep HPF POC: NONE SEEN

## 2015-05-30 LAB — PREGNANCY, URINE: Preg Test, Ur: NEGATIVE

## 2015-05-30 NOTE — ED Notes (Signed)
Pt cont'd phone call and had to be asked to end call for triage to be completed

## 2015-05-30 NOTE — ED Notes (Signed)
Pt states she has to leave to pick up her child and cannot stay for US. Dr. Deretha EmoryZackowski notified and pt to sign out AMA.

## 2015-05-30 NOTE — ED Provider Notes (Addendum)
CSN: [id number]     Arrival date & time 05/30/15  1608 History   First MD Initiated Contact with Patient 05/30/15 1702     Chief Complaint  Patient presents with  . Abdominal Pain     (Consider location/radiation/quality/duration/timing/severity/associated sxs/prior Treatment) The history is provided by the patient.   Patient with complaint of bilateral lower pelvic pain for one week associated with nausea no vomiting no diarrhea. Denies any vaginal discharge or vaginal bleeding. Most recent. That was heavier than usual. Patient denies any dysuria. Home pregnancy test was negative. Patient states pelvic pain is been constant. States that it 6 out of 10.    Past Medical History  Diagnosis Date  . Gestational diabetes   . Bipolar disorder (HCC)   . OCD (obsessive compulsive disorder)    History reviewed. No pertinent past surgical history. No family history on file. Social History  Substance Use Topics  . Smoking status: Current Every Day Smoker    Types: Cigarettes  . Smokeless tobacco: None  . Alcohol Use: Yes     Comment: occ   OB History    No data available     Review of Systems  Constitutional: Negative for fever.  HENT: Negative for congestion.   Eyes: Negative for visual disturbance.  Respiratory: Negative for shortness of breath.   Cardiovascular: Negative for chest pain.  Gastrointestinal: Positive for nausea and abdominal pain. Negative for vomiting.  Genitourinary: Positive for pelvic pain. Negative for dysuria, vaginal bleeding, vaginal discharge and vaginal pain.  Musculoskeletal: Negative for back pain.  Skin: Negative for rash.  Neurological: Negative for headaches.  Hematological: Does not bruise/bleed easily.  Psychiatric/Behavioral: Negative for confusion.      Allergies  Review of patient's allergies indicates no known allergies.  Home Medications   Prior to Admission medications   Not on File   BP 132/87 mmHg  Pulse 68  Temp(Src) 98.3  F (36.8 C) (Oral)  Resp 18  SpO2 100%  LMP 05/23/2015 Physical Exam  Constitutional: She is oriented to person, place, and time. She appears well-developed and well-nourished. No distress.  HENT:  Head: Normocephalic and atraumatic.  Eyes: Conjunctivae and EOM are normal. Pupils are equal, round, and reactive to light.  Neck: Normal range of motion. Neck supple.  Cardiovascular: Normal rate, regular rhythm and normal heart sounds.   No murmur heard. Pulmonary/Chest: Effort normal and breath sounds normal. No respiratory distress.  Abdominal: Soft. Bowel sounds are normal. There is no tenderness.  Genitourinary: Vagina normal and uterus normal. No vaginal discharge found.  No cervical motion tenderness uterus nontender adnexa nontender.  Musculoskeletal: Normal range of motion. She exhibits no edema.  Neurological: She is alert and oriented to person, place, and time. No cranial nerve deficit. She exhibits normal muscle tone. Coordination normal.  Skin: Skin is warm. No rash noted.  Nursing note and vitals reviewed.   ED Course  Procedures (including critical care time) Labs Review Labs Reviewed  WET PREP, GENITAL - Abnormal; Notable for the following:    WBC, Wet Prep HPF POC MODERATE (*)    All other components within normal limits  URINALYSIS, ROUTINE W REFLEX MICROSCOPIC (NOT AT [facility])  PREGNANCY, URINE  HIV ANTIBODY (ROUTINE TESTING)  GC/CHLAMYDIA PROBE AMP ([place]) NOT AT [facility]   Results for orders placed or performed during the hospital encounter of 05/30/15  Wet prep, genital  Result Value Ref Range   Yeast Wet Prep HPF POC NONE SEEN NONE SEEN   Trich,  Wet Prep NONE SEEN NONE SEEN   Clue Cells Wet Prep HPF POC NONE SEEN NONE SEEN   WBC, Wet Prep HPF POC MODERATE (A) NONE SEEN   Sperm NONE SEEN   Urinalysis, Routine w reflex microscopic (not at [facility])  Result Value Ref Range   Color, Urine YELLOW YELLOW   APPearance CLEAR CLEAR   Specific Gravity, Urine 1.006  1.005 - 1.030   pH 6.5 5.0 - 8.0   Glucose, UA NEGATIVE NEGATIVE mg/dL   Hgb urine dipstick NEGATIVE NEGATIVE   Bilirubin Urine NEGATIVE NEGATIVE   Ketones, ur NEGATIVE NEGATIVE mg/dL   Protein, ur NEGATIVE NEGATIVE mg/dL   Nitrite NEGATIVE NEGATIVE   Leukocytes, UA NEGATIVE NEGATIVE  Pregnancy, urine  Result Value Ref Range   Preg Test, Ur NEGATIVE NEGATIVE     Imaging Review No results found. I have personally reviewed and evaluated these images and lab results as part of my medical decision-making.   EKG Interpretation None      MDM   Final diagnoses:  Pelvic pain in female    Bring see test negative. Urinalysis negative for urinary tract infection. Wet prep without any significant findings. Cultures are pending. Patient had a pelvic ultrasound ordered but does she had to leave. Patient leaving AGAINST MEDICAL ADVICE. Patient nontoxic no acute distress.    Vanetta Mulders, MD 05/30/15 1952  Vanetta Mulders, MD 05/30/15 2841

## 2015-05-30 NOTE — ED Notes (Addendum)
abd pain, n/d x 1 week-pt entered triage drinking coffee/dairy drink (was advised need for NPO status due to c/o) and talking on phone-NAD-steady gait

## 2015-05-31 LAB — HIV ANTIBODY (ROUTINE TESTING W REFLEX): HIV SCREEN 4TH GENERATION: NONREACTIVE

## 2015-05-31 LAB — GC/CHLAMYDIA PROBE AMP (~~LOC~~) NOT AT ARMC
CHLAMYDIA, DNA PROBE: NEGATIVE
Neisseria Gonorrhea: NEGATIVE

## 2015-07-22 ENCOUNTER — Encounter (HOSPITAL_BASED_OUTPATIENT_CLINIC_OR_DEPARTMENT_OTHER): Payer: Self-pay | Admitting: *Deleted

## 2015-07-22 ENCOUNTER — Emergency Department (HOSPITAL_BASED_OUTPATIENT_CLINIC_OR_DEPARTMENT_OTHER)
Admission: EM | Admit: 2015-07-22 | Discharge: 2015-07-22 | Disposition: A | Discharge status: Home / Self Care | Payer: Medicaid Other | Attending: Emergency Medicine | Admitting: Emergency Medicine

## 2015-07-22 DIAGNOSIS — M7989 Other specified soft tissue disorders: Secondary | ICD-10-CM | POA: Insufficient documentation

## 2015-07-22 DIAGNOSIS — F17210 Nicotine dependence, cigarettes, uncomplicated: Secondary | ICD-10-CM | POA: Insufficient documentation

## 2015-07-22 DIAGNOSIS — R51 Headache: Secondary | ICD-10-CM | POA: Insufficient documentation

## 2015-07-22 DIAGNOSIS — F319 Bipolar disorder, unspecified: Secondary | ICD-10-CM | POA: Insufficient documentation

## 2015-07-22 DIAGNOSIS — N739 Female pelvic inflammatory disease, unspecified: Secondary | ICD-10-CM | POA: Insufficient documentation

## 2015-07-22 DIAGNOSIS — N898 Other specified noninflammatory disorders of vagina: Secondary | ICD-10-CM

## 2015-07-22 DIAGNOSIS — N730 Acute parametritis and pelvic cellulitis: Secondary | ICD-10-CM

## 2015-07-22 LAB — WET PREP, GENITAL
Sperm: NONE SEEN
Trich, Wet Prep: NONE SEEN
Yeast Wet Prep HPF POC: NONE SEEN

## 2015-07-22 LAB — BASIC METABOLIC PANEL
Anion gap: 9 (ref 5–15)
BUN: 11 mg/dL (ref 6–20)
CO2: 25 mmol/L (ref 22–32)
Calcium: 9.2 mg/dL (ref 8.9–10.3)
Chloride: 102 mmol/L (ref 101–111)
Creatinine, Ser: 0.79 mg/dL (ref 0.44–1.00)
GFR calc Af Amer: >60 mL/min (ref >60)
GFR calc non Af Amer: >60 mL/min (ref >60)
Glucose, Bld: 141 mg/dL — ABNORMAL HIGH (ref 65–99)
Potassium: 3.5 mmol/L (ref 3.5–5.1)
Sodium: 136 mmol/L (ref 135–145)

## 2015-07-22 LAB — URINALYSIS, ROUTINE W REFLEX MICROSCOPIC
BILIRUBIN URINE: NEGATIVE
GLUCOSE, UA: NEGATIVE mg/dL
HGB URINE DIPSTICK: NEGATIVE
Ketones, ur: NEGATIVE mg/dL
Leukocytes, UA: NEGATIVE
Nitrite: NEGATIVE
PH: 5.5 (ref 5.0–8.0)
Protein, ur: NEGATIVE mg/dL
SPECIFIC GRAVITY, URINE: 1.028 (ref 1.005–1.030)

## 2015-07-22 LAB — PREGNANCY, URINE: PREG TEST UR: NEGATIVE

## 2015-07-22 MED ORDER — DOXYCYCLINE HYCLATE 100 MG PO CAPS: 100.0000 mg | ORAL_CAPSULE | Freq: Two times a day (BID) | ORAL | Status: DC

## 2015-07-22 MED ORDER — METRONIDAZOLE 500 MG PO TABS: 500.0000 mg | ORAL_TABLET | Freq: Two times a day (BID) | ORAL | Status: DC

## 2015-07-22 MED ORDER — CEFTRIAXONE SODIUM 250 MG IJ SOLR
250.0000 mg | Freq: Once | INTRAMUSCULAR | Status: AC
  Administered 2015-07-22: 250 mg via INTRAMUSCULAR
  Filled 2015-07-22: qty 250

## 2015-07-22 MED ORDER — LIDOCAINE HCL (PF) 1 % IJ SOLN
INTRAMUSCULAR | Status: AC
  Administered 2015-07-22: 5 mL
  Filled 2015-07-22: qty 5

## 2015-07-22 NOTE — Discharge Instructions (Signed)
Medications: Doxycycline, metronidazole  Treatment: Take doxycycline and metronidazole as prescribed for 2 weeks. You will be called in to 3 days if any of your cultures and tests taken today return positive. If any of your results are positive you should make all of your sexual partners aware of your infection.  Follow-up: Please follow-up with outpatient women's clinic for further evaluation and treatment of your cramping and spotting. They will be able to do an ultrasound for you at this time. Please follow-up with the wellness Center to establish care and to further evaluate your elevated blood sugar and chronic headaches. Please return to emergency department if you develop any new or worsening symptoms.   Pelvic Inflammatory Disease Pelvic inflammatory disease (PID) refers to an infection in some or all of the female organs. The infection can be in the uterus, ovaries, fallopian tubes, or the surrounding tissues in the pelvis. PID can cause abdominal or pelvic pain that comes on suddenly (acute pelvic pain). PID is a serious infection because it can lead to lasting (chronic) pelvic pain or the inability to have children (infertility). CAUSES This condition is most often caused by an infection that is spread during sexual contact. However, the infection can also be caused by the normal bacteria that are found in the vaginal tissues if these bacteria travel upward into the reproductive organs. PID can also occur following:  The birth of a baby.  A miscarriage.  An abortion.  Major pelvic surgery.  The use of an intrauterine device (IUD).  A sexual assault. RISK FACTORS This condition is more likely to develop in women who:  Are younger than 22 years of age.  Are sexually active at 20 age.  Use nonbarrier contraception.  Have multiple sexual partners.  Have sex with someone who has symptoms of an STD (sexually transmitted disease).  Use oral contraception. At times,  certain behaviors can also increase the possibility of getting PID, such as:  Using a vaginal douche.  Having an IUD in place. SYMPTOMS Symptoms of this condition include:  Abdominal or pelvic pain.  Fever.  Chills.  Abnormal vaginal discharge.  Abnormal uterine bleeding.  Unusual pain shortly after the end of a menstrual period.  Painful urination.  Pain with sexual intercourse.  Nausea and vomiting. DIAGNOSIS To diagnose this condition, your health care provider will do a physical exam and take your medical history. A pelvic exam typically reveals great tenderness in the uterus and the surrounding pelvic tissues. You may also have tests, such as:  Lab tests, including a pregnancy test, blood tests, and urine test.  Culture tests of the vagina and cervix to check for an STD.  Ultrasound.  A laparoscopic procedure to look inside the pelvis.  Examining vaginal secretions under a microscope. TREATMENT Treatment for this condition may involve one or more approaches.  Antibiotic medicines may be prescribed to be taken by mouth.  Sexual partners may need to be treated if the infection is caused by an STD.  For more severe cases, hospitalization may be needed to give antibiotics directly into a vein through an IV tube.  Surgery may be needed if other treatments do not help, but this is rare. It may take weeks until you are completely well. If you are diagnosed with PID, you should also be checked for human immunodeficiency virus (HIV). Your health care provider may test you for infection again 3 months after treatment. You should not have unprotected sex. HOME CARE INSTRUCTIONS  Take over-the-counter and  prescription medicines only as told by your health care provider.  If you were prescribed an antibiotic medicine, take it as told by your health care provider. Do not stop taking the antibiotic even if you start to feel better.  Do not have sexual intercourse until  treatment is completed or as told by your health care provider. If PID is confirmed, your recent sexual partners will need treatment, especially if you had unprotected sex.  Keep all follow-up visits as told by your health care provider. This is important. SEEK MEDICAL CARE IF:  You have increased or abnormal vaginal discharge.  Your pain does not improve.  You vomit.  You have a fever.  You cannot tolerate your medicines.  Your partner has an STD.  You have pain when you urinate. SEEK IMMEDIATE MEDICAL CARE IF:  You have increased abdominal or pelvic pain.  You have chills.  Your symptoms are not better in 72 hours even with treatment.   This information is not intended to replace advice given to you by your health care provider. Make sure you discuss any questions you have with your health care provider.   Document Released: 01/27/2005 Document Revised: 10/18/2014 Document Reviewed: 03/06/2014 Elsevier Interactive Patient Education [employer]2016 [employer]vier Inc.

## 2015-07-22 NOTE — ED Notes (Signed)
Pt has multiple complaints . Reports chronic headaches, and lower leg swelling. Wants to have blood sugar checked since she has gestiational dm. Currently not pregnancy to her knowledge. Discussed the need to follow up with primary care office and given information about [facility].

## 2015-07-22 NOTE — ED Provider Notes (Signed)
CSN: [id number]     Arrival date & time 07/22/15  1719 History   First MD Initiated Contact with Patient 07/22/15 1807     Chief Complaint  Patient presents with  . Vaginal Discharge     (Consider location/radiation/quality/duration/timing/severity/associated sxs/prior Treatment) HPI Comments: Patient is a 20 year old female who presents with vaginal discharge that began 3-4 days ago. Patient has a history of bacterial vaginosis and is concerned that it has returned. Patient states there is associated foul odor. Patient is sexually active with her husband. She does not use any barrier protection, she has a implant for birth control. Patient's last menstrual period was at the end of May, however patient has had some spotting intermittently since her last period. Patient has associated severe cramping during her period as well as when she is spotting. Patient's also would like her blood sugar checked because she is concerned that she may have diabetes considering she has had gestational diabetes. She reports some polydipsia and polyuria. Patient also reports chronic headaches that seem to be associated with certain foods. Patient does not have a headache right now. Patient does not have a primary care provider or OB/GYN and has been encouraged in the past to establish care with one. Patient denies any chest pain, shortness of breath, abdominal pain, nausea, vomiting, dysuria.  Patient is a 20 y.o. female presenting with vaginal discharge. The history is provided by the patient.  Vaginal Discharge Associated symptoms: no abdominal pain, no dysuria, no fever, no nausea and no vomiting     Past Medical History  Diagnosis Date  . Gestational diabetes   . Bipolar disorder (HCC)   . OCD (obsessive compulsive disorder)    History reviewed. No pertinent past surgical history. History reviewed. No pertinent family history. Social History  Substance Use Topics  . Smoking status: Current Every Day  Smoker    Types: Cigarettes  . Smokeless tobacco: None  . Alcohol Use: Yes     Comment: occ   OB History    No data available     Review of Systems  Constitutional: Negative for fever and chills.  HENT: Negative for facial swelling and sore throat.   Respiratory: Negative for shortness of breath.   Cardiovascular: Negative for chest pain.  Gastrointestinal: Negative for nausea, vomiting and abdominal pain.  Genitourinary: Positive for vaginal discharge. Negative for dysuria, vaginal pain and pelvic pain.  Musculoskeletal: Negative for back pain.  Skin: Negative for rash and wound.  Neurological: Positive for headaches (chronic, not now).  Psychiatric/Behavioral: The patient is not nervous/anxious.       Allergies  Review of patient's allergies indicates no known allergies.  Home Medications   Prior to Admission medications   Medication Sig Start Date End Date Taking? Authorizing Provider  doxycycline (VIBRAMYCIN) 100 MG capsule Take 1 capsule (100 mg total) by mouth 2 (two) times daily. 07/22/15   Emi HolesAlexandra M Elzie Sheets, PA-C  metroNIDAZOLE (FLAGYL) 500 MG tablet Take 1 tablet (500 mg total) by mouth 2 (two) times daily. 07/22/15   Maille Halliwell M Carmine Carrozza, PA-C   BP 114/80 mmHg  Pulse 74  Temp(Src) 98.4 F (36.9 C) (Oral)  Resp 18  Ht 5\' 5"  (1.651 m)  Wt 72.576 kg  BMI 26.63 kg/m2  SpO2 98%  LMP 07/11/2015 Physical Exam  Constitutional: She appears well-developed and well-nourished. No distress.  HENT:  Head: Normocephalic and atraumatic.  Mouth/Throat: Oropharynx is clear and moist. No oropharyngeal exudate.  Eyes: Conjunctivae are normal. Pupils are equal, round,  and reactive to light. Right eye exhibits no discharge. Left eye exhibits no discharge. No scleral icterus.  Neck: Normal range of motion. Neck supple. No thyromegaly present.  Cardiovascular: Normal rate, regular rhythm, normal heart sounds and intact distal pulses.  Exam reveals no gallop and no friction rub.   No  murmur heard. Pulmonary/Chest: Effort normal and breath sounds normal. No stridor. No respiratory distress. She has no wheezes. She has no rales.  Abdominal: Soft. Bowel sounds are normal. She exhibits no distension. There is tenderness. There is no rebound and no guarding.    Genitourinary: There is no rash or tenderness on the right labia. There is no rash or tenderness on the left labia. Uterus is not enlarged. Cervix exhibits motion tenderness. Right adnexum displays tenderness. Right adnexum displays no mass. Left adnexum displays tenderness. Left adnexum displays no mass. Vaginal discharge (hite, milky) found.  Musculoskeletal: She exhibits no edema.  Lymphadenopathy:    She has no cervical adenopathy.  Neurological: She is alert. Coordination normal.  Skin: Skin is warm and dry. No rash noted. She is not diaphoretic. No pallor.  Psychiatric: She has a normal mood and affect.  Nursing note and vitals reviewed.   ED Course  Procedures (including critical care time) Labs Review Labs Reviewed  WET PREP, GENITAL - Abnormal; Notable for the following:    Clue Cells Wet Prep HPF POC PRESENT (*)    WBC, Wet Prep HPF POC FEW (*)    All other components within normal limits  URINALYSIS, ROUTINE W REFLEX MICROSCOPIC (NOT AT [facility]) - Abnormal; Notable for the following:    APPearance CLOUDY (*)    All other components within normal limits  BASIC METABOLIC PANEL - Abnormal; Notable for the following:    Glucose, Bld 141 (*)    All other components within normal limits  PREGNANCY, URINE  RPR  HIV ANTIBODY (ROUTINE TESTING)  GC/CHLAMYDIA PROBE AMP ([place]) NOT AT [facility]    Imaging Review No results found. I have personally reviewed and evaluated these images and lab results as part of my medical decision-making.   EKG Interpretation None      MDM   BMP shows glucose 141. UA negative. Urine pregnancy negative. Wet prep shows clue cells and few wbc's. GC chlamydia culture sent.  RPR, HIV sent. Patient advised that she will be called in 2-3 days if any of her results return positive. Patient advised to tell any of her sexual partners to be treated if she is positive for any infections. She was also advised to follow-up with the department of health for treatment of RPR or HIV if positive. Due to patient's tenderness and cervical motion tenderness I will treat patient for PID with ceftriaxone, doxycycline, and Flagyl. Patient advised to follow-up with the women's outpatient clinic for further evaluation of her cramping and spotting. I was not able to ultrasound her here due to lack of resources. Patient also advised to follow-up and establish care with the wellness Center for further evaluation of her mildly elevated glucose level and chronic headaches. Patient understands and is in agreement with plan. Patient vitals stable throughout ED course and discharged in satisfactory condition.  Final diagnoses:  Vaginal discharge  PID (acute pelvic inflammatory disease)       Emi Holes, PA-C 07/23/15 0157  Tilden Fossa, MD 07/23/15 1245

## 2015-07-22 NOTE — ED Notes (Signed)
Pt wished not to stay 20 minutes after the rocephin medication given.

## 2015-07-22 NOTE — ED Notes (Signed)
Pt reports that she thinks that she has BV-vaginal discharge x 3-4 days

## 2015-07-24 LAB — RPR: RPR Ser Ql: NONREACTIVE

## 2015-07-24 LAB — HIV ANTIBODY (ROUTINE TESTING W REFLEX): HIV Screen 4th Generation wRfx: NONREACTIVE

## 2015-07-24 LAB — GC/CHLAMYDIA PROBE AMP (~~LOC~~) NOT AT ARMC
Chlamydia: NEGATIVE
Neisseria Gonorrhea: NEGATIVE

## 2015-09-12 ENCOUNTER — Emergency Department (HOSPITAL_BASED_OUTPATIENT_CLINIC_OR_DEPARTMENT_OTHER)
Admission: EM | Admit: 2015-09-12 | Discharge: 2015-09-12 | Disposition: A | Discharge status: Home / Self Care | Payer: Medicaid Other | Attending: Emergency Medicine | Admitting: Emergency Medicine

## 2015-09-12 ENCOUNTER — Encounter (HOSPITAL_BASED_OUTPATIENT_CLINIC_OR_DEPARTMENT_OTHER): Payer: Self-pay

## 2015-09-12 DIAGNOSIS — R6883 Chills (without fever): Secondary | ICD-10-CM | POA: Insufficient documentation

## 2015-09-12 DIAGNOSIS — R0602 Shortness of breath: Secondary | ICD-10-CM | POA: Insufficient documentation

## 2015-09-12 DIAGNOSIS — R0789 Other chest pain: Secondary | ICD-10-CM

## 2015-09-12 DIAGNOSIS — F17210 Nicotine dependence, cigarettes, uncomplicated: Secondary | ICD-10-CM | POA: Insufficient documentation

## 2015-09-12 LAB — CBC WITH DIFFERENTIAL/PLATELET
Basophils Absolute: 0 K/uL (ref 0.0–0.1)
Basophils Relative: 0 %
Eosinophils Absolute: 0.2 K/uL (ref 0.0–0.7)
Eosinophils Relative: 2 %
HCT: 37.8 % (ref 36.0–46.0)
Hemoglobin: 12.9 g/dL (ref 12.0–15.0)
Lymphocytes Relative: 41 %
Lymphs Abs: 3.3 K/uL (ref 0.7–4.0)
MCH: 29.8 pg (ref 26.0–34.0)
MCHC: 34.1 g/dL (ref 30.0–36.0)
MCV: 87.3 fL (ref 78.0–100.0)
Monocytes Absolute: 0.6 K/uL (ref 0.1–1.0)
Monocytes Relative: 7 %
Neutro Abs: 3.9 K/uL (ref 1.7–7.7)
Neutrophils Relative %: 50 %
Platelets: 248 K/uL (ref 150–400)
RBC: 4.33 MIL/uL (ref 3.87–5.11)
RDW: 13.2 % (ref 11.5–15.5)
WBC: 7.9 K/uL (ref 4.0–10.5)

## 2015-09-12 LAB — BASIC METABOLIC PANEL WITH GFR
Anion gap: 6 (ref 5–15)
BUN: 16 mg/dL (ref 6–20)
CO2: 26 mmol/L (ref 22–32)
Calcium: 8.8 mg/dL — ABNORMAL LOW (ref 8.9–10.3)
Chloride: 103 mmol/L (ref 101–111)
Creatinine, Ser: 0.69 mg/dL (ref 0.44–1.00)
GFR calc Af Amer: >60 mL/min
GFR calc non Af Amer: >60 mL/min
Glucose, Bld: 103 mg/dL — ABNORMAL HIGH (ref 65–99)
Potassium: 3.3 mmol/L — ABNORMAL LOW (ref 3.5–5.1)
Sodium: 135 mmol/L (ref 135–145)

## 2015-09-12 LAB — URINALYSIS, ROUTINE W REFLEX MICROSCOPIC
Bilirubin Urine: NEGATIVE
Glucose, UA: NEGATIVE mg/dL
Hgb urine dipstick: NEGATIVE
Ketones, ur: NEGATIVE mg/dL
Nitrite: NEGATIVE
Protein, ur: NEGATIVE mg/dL
Specific Gravity, Urine: 1.019 (ref 1.005–1.030)
pH: 5.5 (ref 5.0–8.0)

## 2015-09-12 LAB — D-DIMER, QUANTITATIVE: D-Dimer, Quant: 0.28 ug{FEU}/mL (ref 0.00–0.50)

## 2015-09-12 LAB — URINE MICROSCOPIC-ADD ON: RBC / HPF: NONE SEEN RBC/hpf (ref 0–5)

## 2015-09-12 LAB — PREGNANCY, URINE: Preg Test, Ur: NEGATIVE

## 2015-09-12 MED ORDER — TRAMADOL HCL 50 MG PO TABS
50.0000 mg | ORAL_TABLET | Freq: Once | ORAL | Status: AC
  Administered 2015-09-12: 50 mg via ORAL
  Filled 2015-09-12: qty 1

## 2015-09-12 NOTE — ED Provider Notes (Signed)
[facility]   CSN: [id number] Arrival date & time: 09/12/15  1621  First Provider Contact:  First MD Initiated Contact with Patient 09/12/15 1644   By signing my name below, I, Tiffany Brewer, attest that this documentation has been prepared under the direction and in the presence of Tiffany Tinnell, PA-C. Electronically Signed: Bridgette Brewer, ED Scribe. 09/12/15. 5:00 PM.  History   Chief Complaint Chief Complaint  Patient presents with  . Chest Pain   HPI Comments: Tiffany Brewer is a 20 y.o. female who presents to the Emergency Department complaining of sudden onset, constant, tight, right-sided chest pain onset two days ago. Pt was at work when the pain came on. Pt states she also has mild shortness of breath and chills. Pt's pain is exacerbated with laying down. Pt is taking Ibuprofen with moderate relief to the pain. She was seen yesterday at an urgent care for these symptoms and was given a shot which she reports did not help. Pt had a chest x-ray done during this visit which was unremarkable. No h/o similar symptoms before. Pt is on birth control, she notes her period is unstable. Pt denies fever, dizziness, dysuria, or any other associated symptoms.   The history is provided by the patient. No language interpreter was used.    Past Medical History:  Diagnosis Date  . Bipolar disorder (HCC)   . Gestational diabetes   . OCD (obsessive compulsive disorder)     There are no active problems to display for this patient.   History reviewed. No pertinent surgical history.  OB History    No data available       Home Medications    Prior to Admission medications   Not on File    Family History No family history on file.  Social History Social History  Substance Use Topics  . Smoking status: Current Every Day Smoker    Types: Cigarettes  . Smokeless tobacco: Never Used  . Alcohol use Yes     Comment: weekly     Allergies   Review of patient's  allergies indicates no known allergies.   Review of Systems Review of Systems  Constitutional: Positive for chills. Negative for fever.  Respiratory: Positive for shortness of breath.   Cardiovascular: Positive for chest pain.  Genitourinary: Negative for dysuria.  Neurological: Negative for dizziness.  All other systems reviewed and are negative.    Physical Exam Updated Vital Signs BP 121/73 (BP Location: Left Arm)   Pulse 78   Temp 98.2 F (36.8 C) (Oral)   Resp 18   Ht  (1.651 m)   Wt 181 lb (82.1 kg)   LMP  (LMP Unknown)   SpO2 100%   BMI 30.12 kg/m   Physical Exam  Constitutional: She is oriented to person, place, and time. She appears well-developed and well-nourished. No distress.  HENT:  Head: Normocephalic and atraumatic.  Mouth/Throat: No oropharyngeal exudate.  Eyes: Conjunctivae and EOM are normal. Pupils are equal, round, and reactive to light. Right eye exhibits no discharge. Left eye exhibits no discharge. No scleral icterus.  Cardiovascular: Normal rate, regular rhythm, normal heart sounds and intact distal pulses.  Exam reveals no gallop and no friction rub.   No murmur heard. Pulmonary/Chest: Effort normal and breath sounds normal. No respiratory distress. She has no wheezes. She has no rales. She exhibits tenderness.  Right anterior chest wall tenderness  Abdominal: Soft. She exhibits no distension. There is no tenderness. There is  no guarding.  Musculoskeletal: Normal range of motion. She exhibits no edema.  Neurological: She is alert and oriented to person, place, and time.  Skin: Skin is warm and dry. No rash noted. She is not diaphoretic. No erythema. No pallor.  Psychiatric: She has a normal mood and affect. Her behavior is normal.  Nursing note and vitals reviewed.    ED Treatments / Results  DIAGNOSTIC STUDIES: Oxygen Saturation is 100% on RA, normal by my interpretation.    COORDINATION OF CARE: 4:48 PM Discussed treatment plan with  pt at bedside which includes blood work and lab work and pt agreed to plan.  Labs (all labs ordered are listed, but only abnormal results are displayed) Labs Reviewed - No data to display  EKG  EKG Interpretation  Date/Time:  Wednesday September 12 2015 16:32:38 EDT Ventricular Rate:  73 PR Interval:    QRS Duration: 95 QT Interval:  385 QTC Calculation: 425 R Axis:   87 Text Interpretation:  Sinus rhythm Baseline wander in lead(s) II aVF V3 ECG OTHERWISE WITHIN NORMAL LIMITS No significant change since last tracing Confirmed by Bebe Shaggy  MD, DONALD ([id number]) on 09/12/2015 4:44:54 PM       Radiology No results found.  Procedures Procedures (including critical care time)  Medications Ordered in ED Medications - No data to display   Initial Impression / Assessment and Plan / ED Course  I have reviewed the triage vital signs and the nursing notes.  Pertinent labs & imaging results that were available during my care of the patient were reviewed by me and considered in my medical decision making (see chart for details).  Clinical Course    Otherwise healthy 20 year old female presents to the ED today complaining of three-day history of right anterior chest wall pain and tightness. She was seen yesterday at [facility] emergency Department for similar symptoms. She had a chest x-ray performed which was unremarkable. I reviewed this chest x-ray myself. She was instructed to have an outpatient breast ultrasound performed. Patient states she's been taking ibuprofen with moderate relief of her symptoms but states that the patient was continued to return. In the ED she appears well and is in no apparent distress. Vital signs are stable. EKG is unremarkable. Do not suspect ACS. D-dimer obtained as patient is on control. This was negative. Do not suspect PE. Other lab work is also unremarkable. The pain is likely musculoskeletal in etiology. Her pain did improve after pain medication. Recommend a  follow-up with PCP as an outpatient.  Case discussed with Dr. Bebe Shaggy who agrees with treatment plan.  Final Clinical Impressions(s) / ED Diagnoses   Final diagnoses:  Chest wall pain    New Prescriptions New Prescriptions   No medications on file  I personally performed the services described in this documentation, which was scribed in my presence. The recorded information has been reviewed and is accurate.      Lester Kinsman [place], PA-C 09/13/15 0028    Zadie Rhine, MD 09/13/15 [phone]

## 2015-09-12 NOTE — ED Notes (Signed)
Pt reports R side chest pain x 2 days, non radiating, denies N/v, SOB

## 2015-09-12 NOTE — Discharge Instructions (Signed)
Continue taking ibuprofen for pain. Follow up to get breast ultrasound as instructed by [facility]. Return to the ED if you experience severe worsening of your symptoms, loss of consciousness, difficulty breathing, fevers, chills.

## 2015-09-12 NOTE — ED Triage Notes (Signed)
C/o PC since yesterday-NAD-steady gait

## 2015-09-14 LAB — URINE CULTURE: Culture: NO GROWTH

## 2015-11-20 ENCOUNTER — Encounter (HOSPITAL_BASED_OUTPATIENT_CLINIC_OR_DEPARTMENT_OTHER): Payer: Self-pay

## 2015-11-20 ENCOUNTER — Emergency Department (HOSPITAL_BASED_OUTPATIENT_CLINIC_OR_DEPARTMENT_OTHER)
Admission: EM | Admit: 2015-11-20 | Discharge: 2015-11-20 | Disposition: A | Discharge status: Home / Self Care | Payer: Medicaid Other | Attending: Emergency Medicine | Admitting: Emergency Medicine

## 2015-11-20 DIAGNOSIS — B9789 Other viral agents as the cause of diseases classified elsewhere: Secondary | ICD-10-CM

## 2015-11-20 DIAGNOSIS — F17210 Nicotine dependence, cigarettes, uncomplicated: Secondary | ICD-10-CM | POA: Insufficient documentation

## 2015-11-20 DIAGNOSIS — J069 Acute upper respiratory infection, unspecified: Secondary | ICD-10-CM | POA: Insufficient documentation

## 2015-11-20 NOTE — ED Provider Notes (Signed)
[facility]   CSN: [id number] Arrival date & time: 11/20/15  2030  By signing my name below, I, Soijett Blue, attest that this documentation has been prepared under the direction and in the presence of Geoffery Lyons, MD. Electronically Signed: Soijett Blue, ED Scribe. 11/20/15. 9:00 PM.   History   Chief Complaint Chief Complaint  Patient presents with  . Cough    HPI Tiffany Brewer is a 20 y.o. female who presents to the Emergency Department complaining of dry cough onset 1 week. She states that she is having associated symptoms of sore throat due to cough. She states that she has tried robitussin, dayquil, and nyquil with no relief for her symptoms. She denies fever, chills, rhinorrhea, SOB, CP, and any other symptoms. Denies taking daily medications.     The history is provided by the patient. No language interpreter was used.    Past Medical History:  Diagnosis Date  . Bipolar disorder (HCC)   . Gestational diabetes   . OCD (obsessive compulsive disorder)     There are no active problems to display for this patient.   History reviewed. No pertinent surgical history.  OB History    No data available       Home Medications    Prior to Admission medications   Not on File    Family History No family history on file.  Social History Social History  Substance Use Topics  . Smoking status: Current Every Day Smoker    Types: Cigarettes  . Smokeless tobacco: Never Used  . Alcohol use Yes     Comment: weekly     Allergies   Review of patient's allergies indicates no known allergies.   Review of Systems Review of Systems  All other systems reviewed and are negative.    Physical Exam Updated Vital Signs BP 137/95 (BP Location: Left Arm)   Pulse 96   Temp 98.2 F (36.8 C) (Oral)   Resp 18   Ht 5\' 5"  (1.651 m)   Wt 181 lb (82.1 kg)   LMP 11/04/2015   SpO2 100%   BMI 30.12 kg/m   Physical Exam  Constitutional: She is  oriented to person, place, and time. She appears well-developed and well-nourished. No distress.  HENT:  Head: Normocephalic and atraumatic.  Right Ear: Hearing normal.  Left Ear: Hearing normal.  Nose: Nose normal.  Mouth/Throat: Oropharynx is clear and moist and mucous membranes are normal.  Eyes: Conjunctivae and EOM are normal. Pupils are equal, round, and reactive to light.  Neck: Normal range of motion. Neck supple.  Cardiovascular: Regular rhythm, S1 normal and S2 normal.  Exam reveals no gallop and no friction rub.   No murmur heard. Pulmonary/Chest: Effort normal and breath sounds normal. No respiratory distress. She exhibits no tenderness.  Abdominal: Soft. Normal appearance and bowel sounds are normal. There is no hepatosplenomegaly. There is no tenderness. There is no rebound, no guarding, no tenderness at McBurney's point and negative Murphy's sign. No hernia.  Musculoskeletal: Normal range of motion.  Neurological: She is alert and oriented to person, place, and time. She has normal strength. No cranial nerve deficit or sensory deficit. Coordination normal. GCS eye subscore is 4. GCS verbal subscore is 5. GCS motor subscore is 6.  Skin: Skin is warm, dry and intact. No rash noted. No cyanosis.  Psychiatric: She has a normal mood and affect. Her speech is normal and behavior is normal. Thought content normal.  Nursing note and  vitals reviewed.    ED Treatments / Results  DIAGNOSTIC STUDIES: Oxygen Saturation is 100% on RA, nl by my interpretation.    COORDINATION OF CARE: 9:00 PM Discussed treatment plan with pt at bedside and pt agreed to plan.   Procedures Procedures (including critical care time)  Medications Ordered in ED Medications - No data to display   Initial Impression / Assessment and Plan / ED Course  I have reviewed the triage vital signs and the nursing notes.   Clinical Course    Symptoms most likely viral in nature. I will recommend continued  over-the-counter medications, plenty of fluids, rest, and when necessary return.  Final Clinical Impressions(s) / ED Diagnoses   Final diagnoses:  None    New Prescriptions New Prescriptions   No medications on file    I personally performed the services described in this documentation, which was scribed in my presence. The recorded information has been reviewed and is accurate.        Geoffery Lyonsouglas Yarimar Lavis, MD 11/20/15 2136

## 2015-11-20 NOTE — Discharge Instructions (Signed)
Drink plenty of fluids and get plenty of rest.  Continue over-the-counter medications as needed for symptomatic relief.

## 2015-11-20 NOTE — ED Triage Notes (Signed)
Pt c/o non productive cough for a week, no fever, no acute distress, texting on phone in triage

## 2016-06-16 ENCOUNTER — Encounter (HOSPITAL_BASED_OUTPATIENT_CLINIC_OR_DEPARTMENT_OTHER): Payer: Self-pay | Admitting: *Deleted

## 2016-06-16 ENCOUNTER — Emergency Department (HOSPITAL_BASED_OUTPATIENT_CLINIC_OR_DEPARTMENT_OTHER)
Admission: EM | Admit: 2016-06-16 | Discharge: 2016-06-16 | Disposition: A | Discharge status: Home / Self Care | Payer: Medicaid Other | Attending: Emergency Medicine | Admitting: Emergency Medicine

## 2016-06-16 DIAGNOSIS — N72 Inflammatory disease of cervix uteri: Secondary | ICD-10-CM | POA: Insufficient documentation

## 2016-06-16 DIAGNOSIS — F17210 Nicotine dependence, cigarettes, uncomplicated: Secondary | ICD-10-CM | POA: Insufficient documentation

## 2016-06-16 LAB — URINALYSIS, ROUTINE W REFLEX MICROSCOPIC
Bilirubin Urine: NEGATIVE
Glucose, UA: NEGATIVE mg/dL
Hgb urine dipstick: NEGATIVE
Ketones, ur: NEGATIVE mg/dL
NITRITE: NEGATIVE
Protein, ur: NEGATIVE mg/dL
SPECIFIC GRAVITY, URINE: 1.022 (ref 1.005–1.030)
pH: 6.5 (ref 5.0–8.0)

## 2016-06-16 LAB — WET PREP, GENITAL
CLUE CELLS WET PREP: NONE SEEN
Sperm: NONE SEEN
Trich, Wet Prep: NONE SEEN
Yeast Wet Prep HPF POC: NONE SEEN

## 2016-06-16 LAB — PREGNANCY, URINE: PREG TEST UR: NEGATIVE

## 2016-06-16 LAB — URINALYSIS, MICROSCOPIC (REFLEX)

## 2016-06-16 MED ORDER — CEFTRIAXONE SODIUM 250 MG IJ SOLR
250.0000 mg | Freq: Once | INTRAMUSCULAR | Status: AC
  Administered 2016-06-16: 250 mg via INTRAMUSCULAR
  Filled 2016-06-16: qty 250

## 2016-06-16 MED ORDER — AZITHROMYCIN 1 G PO PACK
1.0000 g | PACK | Freq: Once | ORAL | Status: AC
  Administered 2016-06-16: 1 g via ORAL
  Filled 2016-06-16: qty 1

## 2016-06-16 MED ORDER — LIDOCAINE HCL (PF) 1 % IJ SOLN
INTRAMUSCULAR | Status: AC
  Administered 2016-06-16: 1.2 mL
  Filled 2016-06-16: qty 5

## 2016-06-16 NOTE — Discharge Instructions (Signed)
Use a condom each time that you have sex. Test performed today are pending to include HIV (AIDS)test and test for syphilis. You will be called if they are abnormal. Follow-up at the women's outpatient clinic if you're symptoms continue. Return if concern for any reason

## 2016-06-16 NOTE — ED Provider Notes (Addendum)
[facility]   CSN: [id number] Arrival date & time: 06/16/16  1213     History   Chief Complaint Chief Complaint  Patient presents with  . Vaginal Discharge    HPI Tiffany Brewer is a 21 y.o. female.  HPI Complains of "citrusy smell" to her urine for the past 3 days. She is uncertain whether or not she's had a vaginal discharge. She denies any abdominal pain. No fever. No dysuria. No other associated symptoms. Past Medical History:  Diagnosis Date  . Bipolar disorder (HCC)   . Gestational diabetes   . OCD (obsessive compulsive disorder)     There are no active problems to display for this patient.   History reviewed. No pertinent surgical history.  OB History    No data available       Home Medications    Prior to Admission medications   Not on File  Indications none. Has implanted birth control  Family History No family history on file.  Social History Social History  Substance Use Topics  . Smoking status: Current Every Day Smoker    Types: Cigarettes  . Smokeless tobacco: Never Used  . Alcohol use Yes     Comment: weekly   Positive marijuana use denies IV drug use no other drug use  Allergies   Patient has no known allergies.   Review of Systems Review of Systems  Genitourinary:       Irregular menses  All other systems reviewed and are negative.    Physical Exam Updated Vital Signs BP 133/81 (BP Location: Right Arm)   Pulse 90   Temp 98.1 F (36.7 C) (Oral)   Resp 18   Ht 5\' 5"  (1.651 m)   Wt 181 lb (82.1 kg)   LMP 05/12/2016   SpO2 100%   BMI 30.12 kg/m   Physical Exam  Constitutional: She appears well-developed and well-nourished.  HENT:  Head: Normocephalic and atraumatic.  Eyes: Conjunctivae are normal. Pupils are equal, round, and reactive to light.  Neck: Neck supple. No tracheal deviation present. No thyromegaly present.  Cardiovascular: Normal rate and regular rhythm.   No murmur  heard. Pulmonary/Chest: Effort normal and breath sounds normal.  Abdominal: Soft. Bowel sounds are normal. She exhibits no distension. There is no tenderness.  Obese  Genitourinary:  Genitourinary Comments: No external lesion. Cervical os closed. Positive yellowish vaginal discharge. Positive cervical motion tenderness no adnexal masses or tenderness  Musculoskeletal: Normal range of motion. She exhibits no edema or tenderness.  Neurological: She is alert. Coordination normal.  Skin: Skin is warm and dry. No rash noted.  Psychiatric: She has a normal mood and affect.  Nursing note and vitals reviewed.    ED Treatments / Results  Labs (all labs ordered are listed, but only abnormal results are displayed) Labs Reviewed  URINALYSIS, ROUTINE W REFLEX MICROSCOPIC - Abnormal; Notable for the following:       Result Value   Leukocytes, UA SMALL (*)    All other components within normal limits  URINALYSIS, MICROSCOPIC (REFLEX) - Abnormal; Notable for the following:    Bacteria, UA RARE (*)    Squamous Epithelial / LPF 0-5 (*)    All other components within normal limits  WET PREP, GENITAL  PREGNANCY, URINE  RPR  HIV ANTIBODY (ROUTINE TESTING)  GC/CHLAMYDIA PROBE AMP ([place]) NOT AT [facility]    EKG  EKG Interpretation None       Radiology No results found.  Procedures Procedures (  including critical care time)  Medications Ordered in ED Medications  cefTRIAXone (ROCEPHIN) injection 250 mg (not administered)  azithromycin (ZITHROMAX) powder 1 g (not administered)   Results for orders placed or performed during the hospital encounter of 06/16/16  Wet prep, genital  Result Value Ref Range   Yeast Wet Prep HPF POC NONE SEEN NONE SEEN   Trich, Wet Prep NONE SEEN NONE SEEN   Clue Cells Wet Prep HPF POC NONE SEEN NONE SEEN   WBC, Wet Prep HPF POC MANY (A) NONE SEEN   Sperm NONE SEEN   Urinalysis, Routine w reflex microscopic  Result Value Ref Range   Color, Urine YELLOW  YELLOW   APPearance CLEAR CLEAR   Specific Gravity, Urine 1.022 1.005 - 1.030   pH 6.5 5.0 - 8.0   Glucose, UA NEGATIVE NEGATIVE mg/dL   Hgb urine dipstick NEGATIVE NEGATIVE   Bilirubin Urine NEGATIVE NEGATIVE   Ketones, ur NEGATIVE NEGATIVE mg/dL   Protein, ur NEGATIVE NEGATIVE mg/dL   Nitrite NEGATIVE NEGATIVE   Leukocytes, UA SMALL (A) NEGATIVE  Pregnancy, urine  Result Value Ref Range   Preg Test, Ur NEGATIVE NEGATIVE  Urinalysis, Microscopic (reflex)  Result Value Ref Range   RBC / HPF 0-5 0 - 5 RBC/hpf   WBC, UA 0-5 0 - 5 WBC/hpf   Bacteria, UA RARE (A) NONE SEEN   Squamous Epithelial / LPF 0-5 (A) NONE SEEN   No results found.  Initial Impression / Assessment and Plan / ED Course  I have reviewed the triage vital signs and the nursing notes.  Pertinent labs & imaging results that were available during my care of the patient were reviewed by me and considered in my medical decision making (see chart for details).   we'll treat empirically for cervicitis. Rocephin and Zithromax. HIV, RPR pending. Referral to women's outpatient clinic  Plan safe sex encouraged.  Final Clinical Impressions(s) / ED Diagnoses  Diagnosis cervicitis Final diagnoses:  None    New Prescriptions New Prescriptions   No medications on file     Doug Sou, MD 06/16/16 1347    Doug Sou, MD 06/16/16 1348

## 2016-06-16 NOTE — ED Triage Notes (Signed)
States her urine smells like lemons. She may have a vaginal discharge but is not sure.

## 2016-06-17 LAB — GC/CHLAMYDIA PROBE AMP (~~LOC~~) NOT AT ARMC
Chlamydia: POSITIVE — AB
Neisseria Gonorrhea: NEGATIVE

## 2016-06-17 LAB — RPR: RPR Ser Ql: NONREACTIVE

## 2016-06-17 LAB — HIV ANTIBODY (ROUTINE TESTING W REFLEX): HIV Screen 4th Generation wRfx: NONREACTIVE

## 2016-09-15 ENCOUNTER — Emergency Department (HOSPITAL_COMMUNITY)
Admission: EM | Admit: 2016-09-15 | Discharge: 2016-09-15 | Disposition: A | Discharge status: Home / Self Care | Payer: Self-pay | Attending: Emergency Medicine | Admitting: Emergency Medicine

## 2016-09-15 ENCOUNTER — Encounter (HOSPITAL_COMMUNITY): Payer: Self-pay

## 2016-09-15 DIAGNOSIS — N898 Other specified noninflammatory disorders of vagina: Secondary | ICD-10-CM | POA: Insufficient documentation

## 2016-09-15 DIAGNOSIS — Z202 Contact with and (suspected) exposure to infections with a predominantly sexual mode of transmission: Secondary | ICD-10-CM | POA: Insufficient documentation

## 2016-09-15 DIAGNOSIS — F17210 Nicotine dependence, cigarettes, uncomplicated: Secondary | ICD-10-CM | POA: Insufficient documentation

## 2016-09-15 LAB — URINALYSIS, ROUTINE W REFLEX MICROSCOPIC
Bilirubin Urine: NEGATIVE
Glucose, UA: NEGATIVE mg/dL
Hgb urine dipstick: NEGATIVE
KETONES UR: NEGATIVE mg/dL
LEUKOCYTES UA: NEGATIVE
NITRITE: NEGATIVE
PROTEIN: NEGATIVE mg/dL
Specific Gravity, Urine: 1.023 (ref 1.005–1.030)
pH: 5 (ref 5.0–8.0)

## 2016-09-15 LAB — WET PREP, GENITAL
CLUE CELLS WET PREP: NONE SEEN
Sperm: NONE SEEN
TRICH WET PREP: NONE SEEN
Yeast Wet Prep HPF POC: NONE SEEN

## 2016-09-15 LAB — POC URINE PREG, ED: PREG TEST UR: NEGATIVE

## 2016-09-15 MED ORDER — AZITHROMYCIN 250 MG PO TABS
1000.0000 mg | ORAL_TABLET | Freq: Once | ORAL | Status: AC
  Administered 2016-09-15: 1000 mg via ORAL
  Filled 2016-09-15: qty 4

## 2016-09-15 MED ORDER — CEFTRIAXONE SODIUM 250 MG IJ SOLR
250.0000 mg | Freq: Once | INTRAMUSCULAR | Status: AC
  Administered 2016-09-15: 250 mg via INTRAMUSCULAR
  Filled 2016-09-15: qty 250
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
## 2016-09-15 NOTE — ED Notes (Signed)
Patient believes she has an STD.  Complaining of white, thick discharge.  Denies any painful urination.  Also complaining of pain when swallowing.

## 2016-09-15 NOTE — ED Triage Notes (Signed)
Pt presents for evaluation of possible STD exposure. Pt endorses vaginal discharge, denies bleeding or pain with urination. Pt reports throat sore with swallowing and some nausea.

## 2016-09-15 NOTE — Discharge Instructions (Signed)
Your tests today are still pending, please make sure we have a good phone number to reach you at in case they come back abnormal and we need to get in touch with you for further treatment. Do not hesitate to return to the emergency department for any new, worsening or concerning symptoms.   [facility]  Please use the resources guide below to establish primary care

## 2016-09-16 LAB — RPR: RPR: NONREACTIVE

## 2016-09-16 LAB — GC/CHLAMYDIA PROBE AMP (~~LOC~~) NOT AT ARMC
CHLAMYDIA, DNA PROBE: NEGATIVE
Neisseria Gonorrhea: NEGATIVE

## 2016-09-16 LAB — HIV ANTIBODY (ROUTINE TESTING W REFLEX): HIV SCREEN 4TH GENERATION: NONREACTIVE

## 2016-12-01 ENCOUNTER — Encounter (HOSPITAL_BASED_OUTPATIENT_CLINIC_OR_DEPARTMENT_OTHER): Payer: Self-pay | Admitting: *Deleted

## 2016-12-01 ENCOUNTER — Emergency Department (HOSPITAL_BASED_OUTPATIENT_CLINIC_OR_DEPARTMENT_OTHER): Payer: Self-pay

## 2016-12-01 ENCOUNTER — Emergency Department (HOSPITAL_BASED_OUTPATIENT_CLINIC_OR_DEPARTMENT_OTHER)
Admission: EM | Admit: 2016-12-01 | Discharge: 2016-12-01 | Disposition: A | Discharge status: Home / Self Care | Payer: Self-pay | Attending: Emergency Medicine | Admitting: Emergency Medicine

## 2016-12-01 DIAGNOSIS — J069 Acute upper respiratory infection, unspecified: Secondary | ICD-10-CM | POA: Insufficient documentation

## 2016-12-01 DIAGNOSIS — B9789 Other viral agents as the cause of diseases classified elsewhere: Secondary | ICD-10-CM

## 2016-12-01 DIAGNOSIS — F17210 Nicotine dependence, cigarettes, uncomplicated: Secondary | ICD-10-CM | POA: Insufficient documentation

## 2016-12-01 MED ORDER — BENZONATATE 100 MG PO CAPS: 100.0000 mg | ORAL_CAPSULE | Freq: Three times a day (TID) | ORAL | 0 refills | Status: DC

## 2016-12-01 MED ORDER — DM-GUAIFENESIN ER 30-600 MG PO TB12: 1.0000 | ORAL_TABLET | Freq: Two times a day (BID) | ORAL | 0 refills | Status: DC

## 2016-12-01 MED ORDER — ALBUTEROL SULFATE HFA 108 (90 BASE) MCG/ACT IN AERS
2.0000 | INHALATION_SPRAY | Freq: Once | RESPIRATORY_TRACT | Status: AC
  Administered 2016-12-01: 2 via RESPIRATORY_TRACT
  Filled 2016-12-01: qty 6.7

## 2016-12-01 MED ORDER — IBUPROFEN 600 MG PO TABS: 600.0000 mg | ORAL_TABLET | Freq: Four times a day (QID) | ORAL | 0 refills | Status: DC | PRN

## 2016-12-01 NOTE — ED Triage Notes (Signed)
Pt reports cough and sore throat x Friday. Denies any fevers or other c/o.

## 2016-12-01 NOTE — Discharge Instructions (Signed)
Please read and follow all provided instructions.  Your diagnoses today include:  1. Viral URI with cough     You appear to have an upper respiratory infection (URI). An upper respiratory tract infection, or cold, is a viral infection of the air passages leading to the lungs. It should improve gradually after 5-7 days. You may have a lingering cough that lasts for 2- 4 weeks after the infection.  Tests performed today include: Vital signs. See below for your results today.   Medications prescribed:   Take any prescribed medications only as directed. Treatment for your infection is aimed at treating the symptoms. There are no medications, such as antibiotics, that will cure your infection.   Tessalon Perles.  This is an anticough medication.  Please keep this away from children as it can be very deadly if children ingest this.  If this were to happen you should call poison control immediately.  Inhaler.  Please take 2 puffs as needed for coughing fits.  If requiring 3 times separated by 20 minutes, you should be reevaluated.  You are prescribed ibuprofen, a non-steroidal anti-inflammatory agent (NSAID) for pain. You may take 600mg  every 6 hours as needed for pain. If still requiring this medication around the clock for acute pain after 10 days, please see your primary healthcare provider.  You may combine this medication with Tylenol, 650 mg every 6 hours, so you are receiving something for pain every 3 hours.  This is not a long-term medication unless under the care and direction of your primary provider. Taking this medication long-term and not under the supervision of a healthcare provider could increase the risk of stomach ulcers, kidney problems, and cardiovascular problems such as high blood pressure.    Home care instructions:  Follow any educational materials contained in this packet.   Your illness is contagious and can be spread to others, especially during the first 3 or 4 days.  It cannot be cured by antibiotics or other medicines. Take basic precautions such as washing your hands often, covering your mouth when you cough or sneeze, and avoiding public places where you could spread your illness to others.   Please continue drinking plenty of fluids.  Use over-the-counter medicines as needed as directed on packaging for symptom relief.  You may also use ibuprofen or tylenol as directed on packaging for pain or fever.  Do not take multiple medicines containing Tylenol or acetaminophen to avoid taking too much of this medication.  Follow-up instructions: Please follow-up with your primary care provider in the next 3 days for further evaluation of your symptoms if you are not feeling better.   Return instructions:  Please return to the Emergency Department if you experience worsening symptoms.  RETURN IMMEDIATELY IF you develop shortness of breath, confusion or altered mental status, a new rash, become dizzy, faint, or poorly responsive, or are unable to be cared for at home. Please return if you have persistent vomiting and cannot keep down fluids or develop a fever that is not controlled by tylenol or motrin.   Please return if you have any other emergent concerns.  Additional Information:  Your vital signs today were: BP (!) 126/97 (BP Location: Right Arm)    Pulse 86    Temp 98.8 F (37.1 C) (Oral)    Resp 20    Ht 5\' 5"  (1.651 m)    Wt 82.1 kg (181 lb)    LMP 11/10/2016    SpO2 100%  BMI 30.12 kg/m  If your blood pressure (BP) was elevated above 135/85 this visit, please have this repeated by your doctor within one month. --------------

## 2016-12-01 NOTE — ED Provider Notes (Signed)
[facility] EMERGENCY DEPARTMENT Provider Note   CSN: [id number] Arrival date & time: 12/01/16  1018     History   Chief Complaint Chief Complaint  Patient presents with  . Cough    HPI Tiffany Brewer is a 21 y.o. female.  HPI   Patient is a 21 year old female with a history of bipolar disorder and OCD presenting for 5-day history of cough, congestion, and sore throat.  Patient reports symptoms started with a sore throat 5 days ago.  Patient reports she had some chills 2 days ago but no recorded fever at home.  Patient reports congestion, cough productive of sputum, and cough worse at night.  No dysphagia, change in phonation. Patient reports that she has tried robitussin, TheraFlu, and steam for her cough without relief. No chronic lung disease. Patient denies any history of immunosuppressed status. Patient is a smoker.   Past Medical History:  Diagnosis Date  . Bipolar disorder (HCC)   . Gestational diabetes   . OCD (obsessive compulsive disorder)     There are no active problems to display for this patient.   History reviewed. No pertinent surgical history.  OB History    No data available       Home Medications    Prior to Admission medications   Not on File    Family History History reviewed. No pertinent family history.  Social History Social History  Substance Use Topics  . Smoking status: Current Every Day Smoker    Types: Cigarettes  . Smokeless tobacco: Never Used  . Alcohol use Yes     Comment: weekly     Allergies   Codeine   Review of Systems Review of Systems  Constitutional: Negative for chills and fever.  HENT: Positive for congestion, rhinorrhea and sore throat. Negative for trouble swallowing and voice change.   Respiratory: Negative for cough and shortness of breath.   Gastrointestinal: Negative for abdominal pain, diarrhea, nausea and vomiting.  Genitourinary: Negative for dysuria.  Musculoskeletal: Negative for  myalgias.  Skin: Negative for rash.  Neurological: Positive for headaches.     Physical Exam Updated Vital Signs BP (!) 126/97 (BP Location: Right Arm)   Pulse 86   Temp 98.8 F (37.1 C) (Oral)   Resp 20   Ht 5\' 5"  (1.651 m)   Wt 82.1 kg (181 lb)   LMP 11/10/2016   SpO2 99%   BMI 30.12 kg/m   Physical Exam  Constitutional: She appears well-developed and well-nourished. No distress.  HENT:  Head: Normocephalic and atraumatic.  Mouth/Throat: No oropharyngeal exudate.  Erythema of posterior pharynx.   Eyes: Pupils are equal, round, and reactive to light. Conjunctivae and EOM are normal.  Neck: Normal range of motion. Neck supple.  No induration of neck or submandibular pain to palpation.  Cardiovascular: Normal rate, regular rhythm, S1 normal and S2 normal.   No murmur heard. Pulmonary/Chest: Effort normal. She has no wheezes. She has no rales.  Diminished lung sounds in b/l lower lung fields.  Abdominal: Soft. She exhibits no distension. There is no tenderness. There is no guarding.  Musculoskeletal: Normal range of motion. She exhibits no edema or deformity.  Lymphadenopathy:    She has no cervical adenopathy.  Neurological: She is alert.  Cranial nerves grossly intact. Patient moves extremities with good coordination and symmetrically.  Skin: Skin is warm and dry. No rash noted. No erythema.  Psychiatric: She has a normal mood and affect. Her behavior is normal. Judgment and thought  content normal.  Nursing note and vitals reviewed.    ED Treatments / Results  Labs (all labs ordered are listed, but only abnormal results are displayed) Labs Reviewed - No data to display  EKG  EKG Interpretation None       Radiology No results found.  Procedures Procedures (including critical care time)  Medications Ordered in ED Medications  albuterol (PROVENTIL HFA;VENTOLIN HFA) 108 (90 Base) MCG/ACT inhaler 2 puff (not administered)     Initial Impression /  Assessment and Plan / ED Course  I have reviewed the triage vital signs and the nursing notes.  Pertinent labs & imaging results that were available during my care of the patient were reviewed by me and considered in my medical decision making (see chart for details).     Final Clinical Impressions(s) / ED Diagnoses   Final diagnoses:  None   Patient is afebrile, well-appearing, and in no acute distress.  Patient exhibits no signs of invasive disease such as Ludwig's angina or peritonsillar abscess with her sore throat.  This is likely a presentation of viral bronchitis.  Due to patient's subjective chest congestion and wheezing, albuterol inhaler prescribed and instructions given in the department today.  Mucinex and Tessalon Perles prescribed.  Patient given return precautions for any worsening shortness of breath, increasing purulent sputum, fevers, dizziness, or lightheadedness with symptoms.  Patient is in understanding and agrees with the plan of care.  This is a supervised visit with Dr. Tilden FossaElizabeth Rees. Evaluation, management, and discharge planning discussed with this attending physician.  New Prescriptions New Prescriptions   No medications on file     Delia ChimesMurray, Shawnte Demarest B, PA-C 12/01/16 1754    Tilden Fossaees, Elizabeth, MD 12/06/16 [phone]

## 2017-01-30 ENCOUNTER — Emergency Department (HOSPITAL_BASED_OUTPATIENT_CLINIC_OR_DEPARTMENT_OTHER)
Admission: EM | Admit: 2017-01-30 | Discharge: 2017-01-30 | Disposition: A | Discharge status: Home / Self Care | Payer: Self-pay | Attending: Emergency Medicine | Admitting: Emergency Medicine

## 2017-01-30 ENCOUNTER — Emergency Department (HOSPITAL_BASED_OUTPATIENT_CLINIC_OR_DEPARTMENT_OTHER): Payer: Self-pay

## 2017-01-30 ENCOUNTER — Encounter (HOSPITAL_BASED_OUTPATIENT_CLINIC_OR_DEPARTMENT_OTHER): Payer: Self-pay | Admitting: Emergency Medicine

## 2017-01-30 ENCOUNTER — Other Ambulatory Visit: Payer: Self-pay

## 2017-01-30 DIAGNOSIS — S0990XA Unspecified injury of head, initial encounter: Secondary | ICD-10-CM | POA: Insufficient documentation

## 2017-01-30 DIAGNOSIS — M542 Cervicalgia: Secondary | ICD-10-CM | POA: Insufficient documentation

## 2017-01-30 DIAGNOSIS — M79602 Pain in left arm: Secondary | ICD-10-CM | POA: Insufficient documentation

## 2017-01-30 DIAGNOSIS — Y929 Unspecified place or not applicable: Secondary | ICD-10-CM | POA: Insufficient documentation

## 2017-01-30 DIAGNOSIS — F17200 Nicotine dependence, unspecified, uncomplicated: Secondary | ICD-10-CM | POA: Insufficient documentation

## 2017-01-30 DIAGNOSIS — Y939 Activity, unspecified: Secondary | ICD-10-CM | POA: Insufficient documentation

## 2017-01-30 DIAGNOSIS — Y999 Unspecified external cause status: Secondary | ICD-10-CM | POA: Insufficient documentation

## 2017-01-30 DIAGNOSIS — T148XXA Other injury of unspecified body region, initial encounter: Secondary | ICD-10-CM

## 2017-01-30 DIAGNOSIS — R079 Chest pain, unspecified: Secondary | ICD-10-CM | POA: Insufficient documentation

## 2017-01-30 DIAGNOSIS — M545 Low back pain: Secondary | ICD-10-CM | POA: Insufficient documentation

## 2017-01-30 DIAGNOSIS — S0003XA Contusion of scalp, initial encounter: Secondary | ICD-10-CM | POA: Insufficient documentation

## 2017-01-30 LAB — PREGNANCY, URINE: Preg Test, Ur: NEGATIVE

## 2017-01-30 MED ORDER — CYCLOBENZAPRINE HCL 10 MG PO TABS: 10.0000 mg | ORAL_TABLET | Freq: Two times a day (BID) | ORAL | 0 refills | Status: DC | PRN
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
## 2017-01-30 NOTE — ED Notes (Addendum)
Consulted M. Hyacinth MeekerMiller, [facility] She is en route to provide assessment and services to the pt.   EDP at bedside at this time.
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
## 2017-02-01 NOTE — SANE Note (Signed)
Follow-up Phone Call  Patient gives verbal consent for a FNE/SANE follow-up phone call in 48-72 hours: NO Patient's telephone number: N/A Patient gives verbal consent to leave voicemail at the phone number listed above: NO DO NOT CALL between the hours of:DO NOT CALL

## 2017-02-01 NOTE — SANE Note (Signed)
Patient will not be moved to the SANE room per her request. Patient notes she has her daughter with her and her daughter needs to be able to watch television as a distraction. Patient does not choose to have any support from family or friends, although she states it is available. RN and MD notified the exam will take place in the ED room.

## 2017-03-20 ENCOUNTER — Other Ambulatory Visit: Payer: Self-pay

## 2017-03-20 ENCOUNTER — Encounter (HOSPITAL_COMMUNITY): Payer: Self-pay | Admitting: Emergency Medicine

## 2017-03-20 ENCOUNTER — Ambulatory Visit (HOSPITAL_COMMUNITY)
Admission: EM | Admit: 2017-03-20 | Discharge: 2017-03-20 | Disposition: A | Discharge status: Home / Self Care | Payer: Self-pay | Attending: Internal Medicine | Admitting: Internal Medicine

## 2017-03-20 DIAGNOSIS — A749 Chlamydial infection, unspecified: Secondary | ICD-10-CM

## 2017-03-20 DIAGNOSIS — Z3202 Encounter for pregnancy test, result negative: Secondary | ICD-10-CM

## 2017-03-20 LAB — POCT PREGNANCY, URINE: Preg Test, Ur: NEGATIVE

## 2017-03-20 MED ORDER — AZITHROMYCIN 250 MG PO TABS
ORAL_TABLET | ORAL | Status: AC
  Filled 2017-03-20: qty 4

## 2017-03-20 MED ORDER — AZITHROMYCIN 250 MG PO TABS
1000.0000 mg | ORAL_TABLET | Freq: Once | ORAL | Status: AC
  Administered 2017-03-20: 1000 mg via ORAL

## 2017-03-20 NOTE — ED Notes (Signed)
Patient called x 1 from the lobby-no answer. 

## 2017-03-20 NOTE — ED Provider Notes (Signed)
[facility]    CSN: [id number] Arrival date & time: 03/20/17  1433     History   Chief Complaint Chief Complaint  Patient presents with  . SEXUALLY TRANSMITTED DISEASE    HPI Tiffany Brewer is a 22 y.o. female.   22 year old female comes in for treatment for chlamydia.  States she was tested at the health department, and was positive for chlamydia.  She states she went for testing due to new sexual partner.  She does endorse some vaginal discharge without itchiness/pain.  She has had intermittent abdominal pain, though she thinks is due to onset of her cycle.  Has had some spotting today causing her to think cycle is about to start.  She denies any nausea, vomiting.  Denies fever, chills, night sweats.  States she is on oral birth control. States health department did not check for pregnancy.       Past Medical History:  Diagnosis Date  . Bipolar disorder (HCC)   . Gestational diabetes   . OCD (obsessive compulsive disorder)     There are no active problems to display for this patient.   History reviewed. No pertinent surgical history.  OB History    No data available       Home Medications    Prior to Admission medications   Not on File    Family History History reviewed. No pertinent family history.  Social History Social History   Tobacco Use  . Smoking status: Current Every Day Smoker    Types: Cigarettes  . Smokeless tobacco: Never Used  Substance Use Topics  . Alcohol use: Yes    Comment: weekly  . Drug use: Yes    Types: Marijuana     Allergies   Codeine   Review of Systems Review of Systems  Reason unable to perform ROS: See HPI as above.     Physical Exam Triage Vital Signs ED Triage Vitals  Enc Vitals Group     BP 03/20/17 1543 (!) 129/92     Pulse Rate 03/20/17 1543 71     Resp 03/20/17 1543 18     Temp 03/20/17 1543 98.5 F (36.9 C)     Temp Source 03/20/17 1543 Oral     SpO2 03/20/17 1543 100 %     Weight --       Height --      Head Circumference --      Peak Flow --      Pain Score 03/20/17 1541 7     Pain Loc --      Pain Edu? --      Excl. in GC? --    No data found.  Updated Vital Signs BP (!) 129/92 (BP Location: Left Arm)   Pulse 71   Temp 98.5 F (36.9 C) (Oral)   Resp 18   LMP 03/20/2017   SpO2 100%   Physical Exam  Constitutional: She is oriented to person, place, and time. She appears well-developed and well-nourished. No distress.  HENT:  Head: Normocephalic and atraumatic.  Eyes: Conjunctivae are normal. Pupils are equal, round, and reactive to light.  Cardiovascular: Normal rate, regular rhythm and normal heart sounds. Exam reveals no gallop and no friction rub.  No murmur heard. Pulmonary/Chest: Effort normal and breath sounds normal. She has no wheezes. She has no rales.  Abdominal: Soft. Bowel sounds are normal. She exhibits no mass. There is no tenderness. There is no rebound, no guarding and no CVA tenderness.  Neurological: She is alert and oriented to person, place, and time.  Skin: Skin is warm and dry.  Psychiatric: She has a normal mood and affect. Her behavior is normal. Judgment normal.     UC Treatments / Results  Labs (all labs ordered are listed, but only abnormal results are displayed) Labs Reviewed  POCT PREGNANCY, URINE    EKG  EKG Interpretation None       Radiology No results found.  Procedures Procedures (including critical care time)  Medications Ordered in UC Medications  azithromycin (ZITHROMAX) tablet 1,000 mg (not administered)     Initial Impression / Assessment and Plan / UC Course  I have reviewed the triage vital signs and the nursing notes.  Pertinent labs & imaging results that were available during my care of the patient were reviewed by me and considered in my medical decision making (see chart for details).    Azithromycin 1 g p.o. given for chlamydia.  Urine pregnancy negative.  No alarming signs on exam.   Refer for sexual activity for the next 7 days.  Return precautions given.  Patient expresses understanding and agrees to plan.  Final Clinical Impressions(s) / UC Diagnoses   Final diagnoses:  Chlamydia    ED Discharge Orders    None         Belinda FisherYu, Amy V, PA-C 03/20/17 1639

## 2017-03-20 NOTE — ED Triage Notes (Signed)
[facility] a [facility]. The patient reported that the [facility] in [facility] diagnosed her with Chlamydia and she came here for treatment because they were taking too long.

## 2017-03-20 NOTE — Discharge Instructions (Signed)
Azithromycin 1000 mg given today.  This will treat for chlamydia.  Pregnancy test negative.  No alarming signs on exam.  Refrain from sexual activity for the next 7 days. Monitor for any worsening of symptoms, fever, worsening abdominal pain, nausea, vomiting, to follow up for reevaluation.

## 2017-03-29 ENCOUNTER — Encounter (HOSPITAL_BASED_OUTPATIENT_CLINIC_OR_DEPARTMENT_OTHER): Payer: Self-pay | Admitting: Emergency Medicine

## 2017-03-29 ENCOUNTER — Emergency Department (HOSPITAL_BASED_OUTPATIENT_CLINIC_OR_DEPARTMENT_OTHER)
Admission: EM | Admit: 2017-03-29 | Discharge: 2017-03-30 | Disposition: A | Discharge status: Home / Self Care | Payer: Medicaid Other | Attending: Emergency Medicine | Admitting: Emergency Medicine

## 2017-03-29 ENCOUNTER — Other Ambulatory Visit: Payer: Self-pay

## 2017-03-29 DIAGNOSIS — B373 Candidiasis of vulva and vagina: Secondary | ICD-10-CM

## 2017-03-29 DIAGNOSIS — F17210 Nicotine dependence, cigarettes, uncomplicated: Secondary | ICD-10-CM | POA: Insufficient documentation

## 2017-03-29 DIAGNOSIS — Z3202 Encounter for pregnancy test, result negative: Secondary | ICD-10-CM | POA: Insufficient documentation

## 2017-03-29 DIAGNOSIS — B3731 Acute candidiasis of vulva and vagina: Secondary | ICD-10-CM

## 2017-03-29 LAB — URINALYSIS, ROUTINE W REFLEX MICROSCOPIC
Bilirubin Urine: NEGATIVE
GLUCOSE, UA: NEGATIVE mg/dL
Hgb urine dipstick: NEGATIVE
Ketones, ur: NEGATIVE mg/dL
LEUKOCYTES UA: NEGATIVE
NITRITE: NEGATIVE
PROTEIN: NEGATIVE mg/dL
Specific Gravity, Urine: >1.03 — ABNORMAL HIGH (ref 1.005–1.030)
pH: 6 (ref 5.0–8.0)

## 2017-03-29 LAB — PREGNANCY, URINE: Preg Test, Ur: NEGATIVE

## 2017-03-29 NOTE — ED Triage Notes (Signed)
Patient reports that she was just treated for chlamydia, but reports that she doesn't think that it was effective  - patient reports that she continues to have vaginal itching and "cottage cheese yellow" discharge

## 2017-03-30 LAB — WET PREP, GENITAL
Clue Cells Wet Prep HPF POC: NONE SEEN
Sperm: NONE SEEN
TRICH WET PREP: NONE SEEN

## 2017-03-30 MED ORDER — FLUCONAZOLE 50 MG PO TABS
150.0000 mg | ORAL_TABLET | Freq: Once | ORAL | Status: AC
  Administered 2017-03-30: 150 mg via ORAL
  Filled 2017-03-30: qty 1

## 2017-03-30 NOTE — Discharge Instructions (Signed)
As discussed, your labs today showed yeast and you were treated with a one-time dose for yeast vaginitis. You will receive a phone call if any of your results return positive.  Follow-up with your primary care provider or GYN. Return sooner if symptoms worsen or new concerning symptoms

## 2017-03-30 NOTE — ED Notes (Signed)
Pt educated about HTN and encouraged to f/u with PCP.

## 2017-03-30 NOTE — ED Provider Notes (Signed)
[facility] EMERGENCY DEPARTMENT Provider Note   CSN: [id number] Arrival date & time: 03/29/17  1848     History   Chief Complaint Chief Complaint  Patient presents with  . Vaginal Discharge    HPI Tiffany Brewer is a 22 y.o. female recently treated for chlamydia returning with persistent vaginal discharge described as thick and clumpy and associated with vaginal pruritus.  She has tried Monistat without relief.  Denies any urinary symptoms, fever, chills, pelvic pain, bleeding or other symptoms.   HPI  Past Medical History:  Diagnosis Date  . Bipolar disorder (HCC)   . Gestational diabetes   . OCD (obsessive compulsive disorder)     There are no active problems to display for this patient.   History reviewed. No pertinent surgical history.  OB History    No data available       Home Medications    Prior to Admission medications   Not on File    Family History History reviewed. No pertinent family history.  Social History Social History   Tobacco Use  . Smoking status: Current Every Day Smoker    Types: Cigarettes  . Smokeless tobacco: Never Used  Substance Use Topics  . Alcohol use: Yes    Comment: weekly  . Drug use: Yes    Types: Marijuana     Allergies   Codeine   Review of Systems Review of Systems  Constitutional: Negative for chills and fever.  Gastrointestinal: Negative for abdominal distention, abdominal pain, nausea and vomiting.  Genitourinary: Positive for vaginal discharge. Negative for decreased urine volume, difficulty urinating, dysuria, flank pain, genital sores, hematuria and pelvic pain.  Musculoskeletal: Negative for myalgias.  Skin: Negative for color change, pallor and rash.     Physical Exam Updated Vital Signs BP (!) 144/97 (BP Location: Right Arm)   Pulse (!) 58   Temp 98.7 F (37.1 C) (Oral)   Resp 18   Ht 5\' 5"  (1.651 m)   Wt 82.1 kg (181 lb)   LMP 03/20/2017   SpO2 100%   BMI 30.12 kg/m    Physical Exam  Constitutional: She appears well-developed and well-nourished. No distress.  Well-appearing, nontoxic afebrile sitting comfortably in bed no acute distress.  HENT:  Head: Atraumatic.  Eyes: EOM are normal.  Neck: Normal range of motion.  Cardiovascular: Normal rate.  Pulmonary/Chest: Effort normal. No respiratory distress.  Genitourinary: Vaginal discharge found.  Genitourinary Comments: copiuos thick white cottage cheese discharge in the vaginal vault. No cervical motion tenderness.  Adnexal mass or pain on palpation.  Musculoskeletal: Normal range of motion.  Neurological: She is alert.  Skin: Skin is warm and dry. No rash noted. She is not diaphoretic. No erythema. No pallor.  Psychiatric: She has a normal mood and affect. Her behavior is normal.  Nursing note and vitals reviewed.    ED Treatments / Results  Labs (all labs ordered are listed, but only abnormal results are displayed) Labs Reviewed  WET PREP, GENITAL - Abnormal; Notable for the following components:      Result Value   Yeast Wet Prep HPF POC PRESENT (*)    WBC, Wet Prep HPF POC MANY (*)    All other components within normal limits  URINALYSIS, ROUTINE W REFLEX MICROSCOPIC - Abnormal; Notable for the following components:   Specific Gravity, Urine >1.030 (*)    All other components within normal limits  PREGNANCY, URINE  GC/CHLAMYDIA PROBE AMP () NOT AT [facility]    EKG  EKG Interpretation None       Radiology No results found.  Procedures Procedures (including critical care time)  Medications Ordered in ED Medications  fluconazole (DIFLUCAN) tablet 150 mg (not administered)     Initial Impression / Assessment and Plan / ED Course  I have reviewed the triage vital signs and the nursing notes.  Pertinent labs & imaging results that were available during my care of the patient were reviewed by me and considered in my medical decision making (see chart for details).     Patient presents with yeast vaginitis.  Treated with a one-time dose of Diflucan in the emergency department.  Otherwise well-appearing, nontoxic afebrile.  Discharge home with close follow-up with GYN or PCP.  Return precautions discussed patient understood and agreed with discharge plan. Final Clinical Impressions(s) / ED Diagnoses   Final diagnoses:  Yeast vaginitis    ED Discharge Orders    None       Gregary CromerMitchell, Megon Kalina B, PA-C 03/30/17 0047    Zadie RhineWickline, Donald, MD 03/30/17 [phone]

## 2017-03-31 LAB — GC/CHLAMYDIA PROBE AMP (~~LOC~~) NOT AT ARMC
CHLAMYDIA, DNA PROBE: NEGATIVE
Neisseria Gonorrhea: NEGATIVE

## 2017-04-06 ENCOUNTER — Ambulatory Visit (HOSPITAL_COMMUNITY)
Admission: EM | Admit: 2017-04-06 | Discharge: 2017-04-06 | Disposition: A | Discharge status: Home / Self Care | Payer: Self-pay | Attending: Family Medicine | Admitting: Family Medicine

## 2017-04-06 ENCOUNTER — Encounter (HOSPITAL_COMMUNITY): Payer: Self-pay | Admitting: Emergency Medicine

## 2017-04-06 ENCOUNTER — Other Ambulatory Visit: Payer: Self-pay

## 2017-04-06 DIAGNOSIS — F429 Obsessive-compulsive disorder, unspecified: Secondary | ICD-10-CM | POA: Insufficient documentation

## 2017-04-06 DIAGNOSIS — N898 Other specified noninflammatory disorders of vagina: Secondary | ICD-10-CM | POA: Insufficient documentation

## 2017-04-06 DIAGNOSIS — F17210 Nicotine dependence, cigarettes, uncomplicated: Secondary | ICD-10-CM | POA: Insufficient documentation

## 2017-04-06 DIAGNOSIS — F319 Bipolar disorder, unspecified: Secondary | ICD-10-CM | POA: Insufficient documentation

## 2017-04-06 MED ORDER — METRONIDAZOLE 500 MG PO TABS: 500.0000 mg | ORAL_TABLET | Freq: Two times a day (BID) | ORAL | 0 refills | Status: AC

## 2017-04-06 NOTE — ED Provider Notes (Signed)
[facility]    CSN: [id number] Arrival date & time: 04/06/17  1158     History   Chief Complaint Chief Complaint  Patient presents with  . Vaginal Discharge    HPI Tiffany Brewer is a 22 y.o. female.   Tiffany Brewer presents with complaints of vaginal discharge which started approximately 2 days ago. She was treated for chlamydia 2/8, symptoms had improved. Then developed vaginal itching, was treated for candidal vaginitis 2/17. Has since developed vaginal discharge which feels similar to previous episodes of bv. No longer itchy. Without pelvic pain. Denies urinary symptoms, back pain. She has not been sexually active since treated for chlamydia. Without fevers.     ROS per HPI.       Past Medical History:  Diagnosis Date  . Bipolar disorder (HCC)   . Gestational diabetes   . OCD (obsessive compulsive disorder)     There are no active problems to display for this patient.   History reviewed. No pertinent surgical history.  OB History    No data available       Home Medications    Prior to Admission medications   Medication Sig Start Date End Date Taking? Authorizing Provider  NON FORMULARY    Yes [provider]  metroNIDAZOLE (FLAGYL) 500 MG tablet Take 1 tablet (500 mg total) by mouth 2 (two) times daily for 7 days. 04/06/17 04/13/17  Georgetta HaberBurky, Deberah Adolf B, NP    Family History Family History  Problem Relation Age of Onset  . Hypertension Mother     Social History Social History   Tobacco Use  . Smoking status: Current Every Day Smoker    Types: Cigarettes  . Smokeless tobacco: Never Used  Substance Use Topics  . Alcohol use: Yes    Comment: weekly  . Drug use: Yes    Types: Marijuana     Allergies   Codeine   Review of Systems Review of Systems   Physical Exam Triage Vital Signs ED Triage Vitals [04/06/17 1352]  Enc Vitals Group     BP (!) 148/99     Pulse Rate 71     Resp 18     Temp 98.6 F (37 C)     Temp Source  Oral     SpO2 100 %     Weight      Height      Head Circumference      Peak Flow      Pain Score      Pain Loc      Pain Edu?      Excl. in GC?    No data found.  Updated Vital Signs BP 139/87 (BP Location: Left Arm)   Pulse 71   Temp 98.6 F (37 C) (Oral)   Resp 18   LMP 03/20/2017   SpO2 100%   Visual Acuity Right Eye Distance:   Left Eye Distance:   Bilateral Distance:    Right Eye Near:   Left Eye Near:    Bilateral Near:     Physical Exam  Constitutional: She is oriented to person, place, and time. She appears well-developed and well-nourished. No distress.  Cardiovascular: Normal rate, regular rhythm and normal heart sounds.  Pulmonary/Chest: Effort normal and breath sounds normal.  Abdominal: Soft. There is no tenderness.  Neurological: She is alert and oriented to person, place, and time.  Skin: Skin is warm and dry.     UC Treatments / Results  Labs (all labs ordered  are listed, but only abnormal results are displayed) Labs Reviewed  CERVICOVAGINAL ANCILLARY ONLY    EKG  EKG Interpretation None       Radiology No results found.  Procedures Procedures (including critical care time)  Medications Ordered in UC Medications - No data to display   Initial Impression / Assessment and Plan / UC Course  I have reviewed the triage vital signs and the nursing notes.  Pertinent labs & imaging results that were available during my care of the patient were reviewed by me and considered in my medical decision making (see chart for details).     Flagyl initiated pending vaginal cytology. Will notify of any positive findings and if any changes to treatment are needed.  If symptoms worsen or do not improve in the next week to return to be seen or to follow up with PCP.  Patient verbalized understanding and agreeable to plan.    Final Clinical Impressions(s) / UC Diagnoses   Final diagnoses:  Vaginal discharge    ED Discharge Orders         Ordered    metroNIDAZOLE (FLAGYL) 500 MG tablet  2 times daily     04/06/17 1419       Controlled Substance Prescriptions [place] Controlled Substance Registry consulted? Not Applicable   Georgetta Haber, NP 04/06/17 1425

## 2017-04-06 NOTE — ED Triage Notes (Signed)
Vaginal discharge for 2 days.  No abdominal pain or back pain

## 2017-04-06 NOTE — Discharge Instructions (Signed)
Complete course of antibiotics.   Do not drink alcohol or have intercourse for the next week. If symptoms worsen or do not improve in the next week to return to be seen or to follow up with your PCP.

## 2017-04-06 NOTE — ED Notes (Signed)
Sent to bathroom for a dirty and clean urine with instructions 

## 2017-04-07 LAB — CERVICOVAGINAL ANCILLARY ONLY
BACTERIAL VAGINITIS: POSITIVE — AB
CANDIDA VAGINITIS: NEGATIVE
Chlamydia: NEGATIVE
Neisseria Gonorrhea: NEGATIVE
TRICH (WINDOWPATH): NEGATIVE

## 2017-04-19 ENCOUNTER — Emergency Department (HOSPITAL_BASED_OUTPATIENT_CLINIC_OR_DEPARTMENT_OTHER)
Admission: EM | Admit: 2017-04-19 | Discharge: 2017-04-20 | Disposition: A | Discharge status: Home / Self Care | Payer: Self-pay | Attending: Emergency Medicine | Admitting: Emergency Medicine

## 2017-04-19 ENCOUNTER — Other Ambulatory Visit: Payer: Self-pay

## 2017-04-19 ENCOUNTER — Encounter (HOSPITAL_BASED_OUTPATIENT_CLINIC_OR_DEPARTMENT_OTHER): Payer: Self-pay | Admitting: *Deleted

## 2017-04-19 DIAGNOSIS — F17210 Nicotine dependence, cigarettes, uncomplicated: Secondary | ICD-10-CM | POA: Insufficient documentation

## 2017-04-19 DIAGNOSIS — R197 Diarrhea, unspecified: Secondary | ICD-10-CM | POA: Insufficient documentation

## 2017-04-19 DIAGNOSIS — R112 Nausea with vomiting, unspecified: Secondary | ICD-10-CM | POA: Insufficient documentation

## 2017-04-19 NOTE — ED Notes (Signed)
Pt given a cup for urine specimen, states she can't obtain one at this time

## 2017-04-19 NOTE — ED Triage Notes (Addendum)
Pt reports she thinks she has a "stomach bug". Vomited x 3 yesterday (none today).Diarrhea today x 5. C/o stomach "upset", denies pain

## 2017-04-20 LAB — URINALYSIS, ROUTINE W REFLEX MICROSCOPIC
Bilirubin Urine: NEGATIVE
Glucose, UA: NEGATIVE mg/dL
HGB URINE DIPSTICK: NEGATIVE
Ketones, ur: 15 mg/dL — AB
Leukocytes, UA: NEGATIVE
NITRITE: NEGATIVE
PROTEIN: NEGATIVE mg/dL
SPECIFIC GRAVITY, URINE: 1.02 (ref 1.005–1.030)
pH: 6.5 (ref 5.0–8.0)

## 2017-04-20 LAB — PREGNANCY, URINE: PREG TEST UR: NEGATIVE

## 2017-04-20 MED ORDER — ONDANSETRON 4 MG PO TBDP: 4.0000 mg | ORAL_TABLET | Freq: Three times a day (TID) | ORAL | 0 refills | Status: DC | PRN

## 2017-04-20 MED ORDER — ONDANSETRON 4 MG PO TBDP
4.0000 mg | ORAL_TABLET | Freq: Once | ORAL | Status: AC
  Administered 2017-04-20: 4 mg via ORAL
  Filled 2017-04-20: qty 1

## 2017-04-20 NOTE — Discharge Instructions (Signed)
You likely have a viral stomach bug.  Make sure to stay hydrated.  Take Zofran as needed for nausea.

## 2017-04-20 NOTE — ED Notes (Signed)
EDP into room, prior to RN assessment, see MD notes, pending orders.   

## 2017-04-20 NOTE — ED Notes (Signed)
Alert, NAD, calm, interactive, resps e/u, speaking in clear complete sentences, no dyspnea noted, skin W&D. Denies questions or needs. Child in stretcher with pt.

## 2017-04-20 NOTE — ED Notes (Signed)
Given apple juice per request (PO challenge).

## 2017-04-20 NOTE — ED Provider Notes (Signed)
[facility] EM[facility]GENCY DEPARTMENT Provider Note   CSN: [id number] Arrival date & time: 04/19/17  2149     History   Chief Complaint Chief Complaint  Patient presents with  . Emesis    HPI Tiffany Brewer is a 22 y.o. female.  HPI  This is a 22 year old female who presents with nausea, vomiting, abdominal pain.  Less than 24-hour history of nonbilious, nonbloody emesis and nonbloody diarrhea.  Reports multiple episodes.  She reports sick contacts at work.  She at times reports some abdominal pain.  She is currently pain-free.  When she has pain she describes as "feeling nauseous."  Last visual period was 1 month ago.  She denies any chance she could be pregnant.  Denies any urinary symptoms, fevers, vaginal discharge.  Past Medical History:  Diagnosis Date  . Bipolar disorder (HCC)   . Gestational diabetes   . OCD (obsessive compulsive disorder)     There are no active problems to display for this patient.   Past Surgical History:  Procedure Laterality Date  . INDUCED ABORTION      OB History    No data available       Home Medications    Prior to Admission medications   Medication Sig Start Date End Date Taking? Authorizing Provider  NON FORMULARY     [provider]  ondansetron (ZOFRAN ODT) 4 MG disintegrating tablet Take 1 tablet (4 mg total) by mouth every 8 (eight) hours as needed for nausea or vomiting. 04/20/17   Jacque Garrels, Mayer Masker, MD    Family History Family History  Problem Relation Age of Onset  . Hypertension Mother     Social History Social History   Tobacco Use  . Smoking status: Current Every Day Smoker    Types: Cigarettes  . Smokeless tobacco: Never Used  Substance Use Topics  . Alcohol use: Yes    Comment: weekly  . Drug use: Yes    Types: Marijuana    Comment: denies     Allergies   Codeine   Review of Systems Review of Systems  Constitutional: Negative for fever.  Respiratory: Negative for shortness of  breath.   Cardiovascular: Negative for chest pain.  Gastrointestinal: Positive for abdominal pain, diarrhea, nausea and vomiting.  Genitourinary: Negative for dysuria and vaginal discharge.  Musculoskeletal: Negative for back pain.  All other systems reviewed and are negative.    Physical Exam Updated Vital Signs BP (!) 137/95 (BP Location: Left Arm)   Pulse 78   Temp 99.2 F (37.3 C) (Oral)   Resp 16   Ht  (1.651 m)   Wt 80.5 kg (177 lb 7.5 oz)   SpO2 100%   BMI 29.53 kg/m   Physical Exam  Constitutional: She is oriented to person, place, and time. She appears well-developed and well-nourished. No distress.  HENT:  Head: Normocephalic and atraumatic.  This membranes moist  Cardiovascular: Normal rate, regular rhythm and normal heart sounds.  Pulmonary/Chest: Effort normal and breath sounds normal. No respiratory distress. She has no wheezes.  Abdominal: Soft. There is no tenderness. There is no guarding.  Neurological: She is alert and oriented to person, place, and time.  Skin: Skin is warm and dry.  Psychiatric: She has a normal mood and affect.  Nursing note and vitals reviewed.    ED Treatments / Results  Labs (all labs ordered are listed, but only abnormal results are displayed) Labs Reviewed  URINALYSIS, ROUTINE W REFLEX MICROSCOPIC - Abnormal; Notable  for the following components:      Result Value   Ketones, ur 15 (*)    All other components within normal limits  PREGNANCY, URINE    EKG  EKG Interpretation None       Radiology No results found.  Procedures Procedures (including critical care time)  Medications Ordered in ED Medications  ondansetron (ZOFRAN-ODT) disintegrating tablet 4 mg (4 mg Oral Given 04/20/17 0018)     Initial Impression / Assessment and Plan / ED Course  I have reviewed the triage vital signs and the nursing notes.  Pertinent labs & imaging results that were available during my care of the patient were reviewed  by me and considered in my medical decision making (see chart for details).     Patient presents with nausea, vomiting, and diarrhea.  Nontoxic appearing vital signs reassuring.  Abdominal exam is benign.  Doubt appendicitis, cholecystitis, obstruction.  Suspect viral etiology.  15 ketones in the urine but otherwise urinalysis is reassuring.  Negative pregnancy test.  Recommend supportive measures including Zofran.  Patient able to orally hydrate prior to discharge.  After history, exam, and medical workup I feel the patient has been appropriately medically screened and is safe for discharge home. Pertinent diagnoses were discussed with the patient. Patient was given return precautions.   Final Clinical Impressions(s) / ED Diagnoses   Final diagnoses:  Nausea vomiting and diarrhea    ED Discharge Orders        Ordered    ondansetron (ZOFRAN ODT) 4 MG disintegrating tablet  Every 8 hours PRN     04/20/17 0024       Shon Baton, MD 04/20/17 [phone]

## 2017-05-17 ENCOUNTER — Other Ambulatory Visit: Payer: Self-pay

## 2017-05-17 ENCOUNTER — Ambulatory Visit (HOSPITAL_COMMUNITY)
Admission: EM | Admit: 2017-05-17 | Discharge: 2017-05-17 | Disposition: A | Discharge status: Home / Self Care | Payer: Self-pay | Attending: Physician Assistant | Admitting: Physician Assistant

## 2017-05-17 ENCOUNTER — Encounter (HOSPITAL_COMMUNITY): Payer: Self-pay | Admitting: *Deleted

## 2017-05-17 DIAGNOSIS — Z8249 Family history of ischemic heart disease and other diseases of the circulatory system: Secondary | ICD-10-CM | POA: Insufficient documentation

## 2017-05-17 DIAGNOSIS — F17210 Nicotine dependence, cigarettes, uncomplicated: Secondary | ICD-10-CM | POA: Insufficient documentation

## 2017-05-17 DIAGNOSIS — N898 Other specified noninflammatory disorders of vagina: Secondary | ICD-10-CM | POA: Insufficient documentation

## 2017-05-17 DIAGNOSIS — K529 Noninfective gastroenteritis and colitis, unspecified: Secondary | ICD-10-CM | POA: Insufficient documentation

## 2017-05-17 DIAGNOSIS — Z8632 Personal history of gestational diabetes: Secondary | ICD-10-CM | POA: Insufficient documentation

## 2017-05-17 DIAGNOSIS — F319 Bipolar disorder, unspecified: Secondary | ICD-10-CM | POA: Insufficient documentation

## 2017-05-17 DIAGNOSIS — F429 Obsessive-compulsive disorder, unspecified: Secondary | ICD-10-CM | POA: Insufficient documentation

## 2017-05-17 DIAGNOSIS — Z885 Allergy status to narcotic agent status: Secondary | ICD-10-CM | POA: Insufficient documentation

## 2017-05-17 MED ORDER — ONDANSETRON HCL 4 MG PO TABS: 4.0000 mg | ORAL_TABLET | Freq: Four times a day (QID) | ORAL | 0 refills | Status: DC

## 2017-05-17 MED ORDER — METRONIDAZOLE 500 MG PO TABS: 500.0000 mg | ORAL_TABLET | Freq: Two times a day (BID) | ORAL | 0 refills | Status: DC

## 2017-05-17 MED ORDER — METRONIDAZOLE 0.75 % VA GEL: 1.0000 | Freq: Two times a day (BID) | VAGINAL | 0 refills | Status: DC

## 2017-05-17 NOTE — Discharge Instructions (Signed)
Please use the medications as prescribed.  If you want to take medicine for diarrhea Imodium can be very helpful.

## 2017-05-17 NOTE — ED Triage Notes (Signed)
Reports vaginal discharge x 1 wk after having vaginal sexual intercourse following anal intercourse. This AM started with vomiting.  Able to keep down PO fluids with some nausea, also c/o diarrhea now.  Denies fevers.

## 2017-05-17 NOTE — ED Provider Notes (Signed)
05/17/2017 1:06 PM   DOB: 1995-06-16 / MRN: [id number]  SUBJECTIVE:  Tiffany Brewer is a 22 y.o. female presenting for multiple complaints.  She tells me that about 5 days ago she had anal to vaginal sex with her partner and shortly thereafter began to notice a foul "fishy" odor emanating from her vagina along with a clear discharge.  She believes the relationship with her partner to be monogamous.  She is taking birth control and not missing doses.  She ate at [employer] last night roughly 2 hours later began having nausea and emesis.  The symptoms improved quickly now she is having nonbloody diarrhea.  She continues to have some mild nausea.  She denies focal abdominal pain  She is allergic to codeine.   She  has a past medical history of Bipolar disorder (HCC), Gestational diabetes, and OCD (obsessive compulsive disorder).    She  reports that she has been smoking cigarettes.  She has never used smokeless tobacco. She reports that she drinks alcohol. She reports that she has current or past drug history. Drug: Marijuana. She  reports that she currently engages in sexual activity. She reports using the following method of birth control/protection: Pill. The patient  has a past surgical history that includes Induced abortion.  Her family history includes Hypertension in her mother.  Review of Systems  Constitutional: Negative for chills, diaphoresis and fever.  Respiratory: Negative for cough, hemoptysis, sputum production, shortness of breath and wheezing.   Cardiovascular: Negative for chest pain, orthopnea and leg swelling.  Gastrointestinal: Positive for diarrhea and vomiting. Negative for abdominal pain, blood in stool, constipation, heartburn, melena and nausea.  Genitourinary: Negative for dysuria, flank pain, frequency, hematuria and urgency.  Skin: Negative for rash.  Neurological: Negative for dizziness.    OBJECTIVE:  BP (!) 142/96   Pulse 79   Temp 98.3 F (36.8 C) (Oral)   Resp  18   LMP 04/22/2017 (Exact Date)   SpO2 100%   Physical Exam  Constitutional: She is active.  Non-toxic appearance.  Cardiovascular: Normal rate, regular rhythm, S1 normal, S2 normal, normal heart sounds and intact distal pulses. Exam reveals no gallop, no friction rub and no decreased pulses.  No murmur heard. Pulmonary/Chest: Effort normal. No stridor. No tachypnea. No respiratory distress. She has no wheezes. She has no rales.  Abdominal: Soft. Normal appearance and bowel sounds are normal. She exhibits no distension and no mass. There is no tenderness. There is no rigidity, no rebound, no guarding and no CVA tenderness.  Musculoskeletal: She exhibits no edema.  Neurological: She is alert.  Skin: Skin is warm and dry. She is not diaphoretic. No pallor.    No results found for this or any previous visit (from the past 72 hour(s)).  No results found.  ASSESSMENT AND PLAN:   Gastroenteritis - Symptoms started immediately after fast food meal.  No red flags on exam or HPI.  Will treat symptomatically.  Vaginal odor - Patient has a history of bacterial vaginosis.  She is requesting pills over gel due to cost.  She is agreeable to a self collect screening for gonorrhea, chlamydia, trichomonas, BV, Gardnerella.  Vaginal discharge      The patient is advised to call or return to clinic if she does not see an improvement in symptoms, or to seek the care of the closest emergency department if she worsens with the above plan.   Deliah BostonMichael Clark, MHS, PA-C 05/17/2017 1:06 PM    Deliah Bostonlark, Michael  L, PA-C 05/17/17 1307

## 2017-05-18 ENCOUNTER — Telehealth (HOSPITAL_COMMUNITY): Payer: Self-pay

## 2017-05-18 ENCOUNTER — Other Ambulatory Visit: Payer: Self-pay | Admitting: Physician Assistant

## 2017-05-18 LAB — CERVICOVAGINAL ANCILLARY ONLY
BACTERIAL VAGINITIS: POSITIVE — AB
CANDIDA VAGINITIS: NEGATIVE
CHLAMYDIA, DNA PROBE: POSITIVE — AB
Neisseria Gonorrhea: NEGATIVE
Trichomonas: NEGATIVE

## 2017-05-18 MED ORDER — AZITHROMYCIN 250 MG PO TABS: ORAL_TABLET | ORAL | 0 refills | Status: DC

## 2017-05-18 NOTE — Telephone Encounter (Signed)
[facility] made aware of positive result

## 2017-06-05 ENCOUNTER — Telehealth (HOSPITAL_COMMUNITY): Payer: Self-pay | Admitting: *Deleted

## 2017-10-02 ENCOUNTER — Other Ambulatory Visit: Payer: Self-pay

## 2017-10-02 ENCOUNTER — Encounter (HOSPITAL_BASED_OUTPATIENT_CLINIC_OR_DEPARTMENT_OTHER): Payer: Self-pay | Admitting: Emergency Medicine

## 2017-10-02 ENCOUNTER — Emergency Department (HOSPITAL_BASED_OUTPATIENT_CLINIC_OR_DEPARTMENT_OTHER)
Admission: EM | Admit: 2017-10-02 | Discharge: 2017-10-02 | Disposition: A | Discharge status: Home / Self Care | Payer: Self-pay | Attending: Emergency Medicine | Admitting: Emergency Medicine

## 2017-10-02 DIAGNOSIS — Z79899 Other long term (current) drug therapy: Secondary | ICD-10-CM | POA: Insufficient documentation

## 2017-10-02 DIAGNOSIS — Z9109 Other allergy status, other than to drugs and biological substances: Secondary | ICD-10-CM | POA: Insufficient documentation

## 2017-10-02 DIAGNOSIS — R51 Headache: Secondary | ICD-10-CM | POA: Insufficient documentation

## 2017-10-02 DIAGNOSIS — R519 Headache, unspecified: Secondary | ICD-10-CM

## 2017-10-02 DIAGNOSIS — F17210 Nicotine dependence, cigarettes, uncomplicated: Secondary | ICD-10-CM | POA: Insufficient documentation

## 2017-10-02 MED ORDER — GLYCOPYRRONIUM TOSYLATE 2.4 % EX PADS: 1.0000 | MEDICATED_PAD | Freq: Every day | CUTANEOUS | 1 refills | Status: DC

## 2017-10-02 MED ORDER — CETIRIZINE HCL 5 MG PO TABS: 5.0000 mg | ORAL_TABLET | Freq: Every day | ORAL | 1 refills | Status: DC

## 2017-10-02 NOTE — ED Notes (Signed)
Pt understood dc material. NAD noted. SCript given at dc 

## 2017-10-02 NOTE — ED Provider Notes (Signed)
[facility] EMERGENCY DEPARTMENT Provider Note   CSN: [id number] Arrival date & time: 10/02/17  2125     History   Chief Complaint Chief Complaint  Patient presents with  . Headache    HPI Tiffany Brewer is a 22 y.o. female.  Patient is a 22 year old female with a history of gestational diabetes and bipolar disease presenting today with headaches that have been intermittent for the last 1 year.  She states they seem to start after having Nexplanon removed and being put on oral birth control.  She states in a week she usually gets 3 to 4 days of headache.  She notices the most when she goes from sitting to standing and she describes it like a rush in the front of her head.  She denies any syncope, vision changes.  She states the symptoms were happening before she had her glasses prescription changed.  She is not taking any over-the-counter medications and denies having a headache currently.  She has no numbness or tingling.  She does complain about having frequent allergies and congestion but she is not sure what she is allergic to.  She was concerned it may be her blood pressure.  The history is provided by the patient.  Headache   This is a chronic problem. Episode onset: 1 year. Episode frequency: 3-4 times per week. The problem has not changed since onset.Associated with: usually present when standing up. The pain is located in the frontal region. The quality of the pain is described as throbbing. The pain is at a severity of 5/10. The pain is moderate. The pain does not radiate. Pertinent negatives include no nausea and no vomiting. She has tried nothing for the symptoms. The treatment provided no relief.    Past Medical History:  Diagnosis Date  . Bipolar disorder (HCC)   . Gestational diabetes   . OCD (obsessive compulsive disorder)     There are no active problems to display for this patient.   Past Surgical History:  Procedure Laterality Date  . INDUCED ABORTION        OB History   None      Home Medications    Prior to Admission medications   Medication Sig Start Date End Date Taking? Authorizing Provider  azithromycin (ZITHROMAX) 250 MG tablet Take four tabs once.  Eat something when you take the medication. 05/18/17   Isa Rankin, MD  metroNIDAZOLE (FLAGYL) 500 MG tablet Take 1 tablet (500 mg total) by mouth 2 (two) times daily. Take with meals. DO NOT CONSUME ALCOHOL WHILE TAKING THIS MEDICATION. 05/17/17   Ofilia Neas, PA-C  NON FORMULARY     [provider]  ondansetron (ZOFRAN ODT) 4 MG disintegrating tablet Take 1 tablet (4 mg total) by mouth every 8 (eight) hours as needed for nausea or vomiting. 04/20/17   Horton, Mayer Masker, MD  ondansetron (ZOFRAN) 4 MG tablet Take 1 tablet (4 mg total) by mouth every 6 (six) hours. 05/17/17   Ofilia Neas, PA-C  UNKNOWN TO PATIENT BCPs    [provider]    Family History Family History  Problem Relation Age of Onset  . Hypertension Mother     Social History Social History   Tobacco Use  . Smoking status: Current Every Day Smoker    Types: Cigarettes  . Smokeless tobacco: Never Used  Substance Use Topics  . Alcohol use: Yes    Comment: weekly  . Drug use: Yes    Types:  Marijuana     Allergies   Codeine   Review of Systems Review of Systems  Gastrointestinal: Negative for nausea and vomiting.  Neurological: Positive for headaches.  All other systems reviewed and are negative.    Physical Exam Updated Vital Signs BP (!) 141/99 (BP Location: Left Arm)   Pulse 77   Temp 98.4 F (36.9 C) (Oral)   Resp 20   Ht 5\' 5"  (1.651 m)   Wt 82.7 kg   LMP 09/10/2017   SpO2 98%   BMI 30.34 kg/m   Physical Exam  Constitutional: She is oriented to person, place, and time. She appears well-developed and well-nourished. No distress.  HENT:  Head: Normocephalic and atraumatic.  Nose: Mucosal edema present.  Mouth/Throat: Oropharynx is clear and moist.    Severely swollen and edematous nasal turbinates bilaterally  Eyes: Pupils are equal, round, and reactive to light. Conjunctivae and EOM are normal.  Neck: Normal range of motion. Neck supple.  Cardiovascular: Normal rate, regular rhythm and intact distal pulses.  No murmur heard. Pulmonary/Chest: Effort normal and breath sounds normal. No respiratory distress. She has no wheezes. She has no rales.  Abdominal: Soft. She exhibits no distension. There is no tenderness. There is no rebound and no guarding.  Musculoskeletal: Normal range of motion. She exhibits no edema or tenderness.  Neurological: She is alert and oriented to person, place, and time. She has normal strength. No cranial nerve deficit or sensory deficit. Gait normal.  Skin: Skin is warm and dry. No rash noted. No erythema.  Psychiatric: She has a normal mood and affect. Her behavior is normal.  Nursing note and vitals reviewed.    ED Treatments / Results  Labs (all labs ordered are listed, but only abnormal results are displayed) Labs Reviewed - No data to display  EKG None  Radiology No results found.  Procedures Procedures (including critical care time)  Medications Ordered in ED Medications - No data to display   Initial Impression / Assessment and Plan / ED Course  I have reviewed the triage vital signs and the nursing notes.  Pertinent labs & imaging results that were available during my care of the patient were reviewed by me and considered in my medical decision making (see chart for details).     Recent presenting with chronic headaches over the last year.  Seem to be related to standing up.  But she does not describe it as a lightheadedness or near syncope.  Patient has severely swollen nasal turbinates and does have issues with allergies.  This may be the result of some of her headaches.  She is not taking anything for the headaches.  She does not use any nasal sprays or over-the-counter medications but  does take an oral birth control.  She also states the symptoms did not start happening until after she switched birth controls.  She is well-appearing on exam today.  She does not describe headache concerning for subarachnoid hemorrhage, space-occupying lesion, venous thrombosis or infectious etiology.  Discussed using a nasal spray for her swollen nasal turbinates but she did not want to use a nasal spray but will try Zyrtec.  Orthostatics wnl.  Mild elevation of diastolic pressure but encouraged to f/u with pcp.  Final Clinical Impressions(s) / ED Diagnoses   Final diagnoses:  Headache disorder    ED Discharge Orders         Ordered    Glycopyrronium Tosylate (QBREXZA) 2.4 % PADS  Daily  10/02/17 2208    cetirizine (ZYRTEC) 5 MG tablet  Daily     10/02/17 2208           Gwyneth SproutPlunkett, Nikeia Henkes, MD 10/02/17 2210

## 2017-10-02 NOTE — ED Notes (Signed)
Lying flat for orthostatics 

## 2017-10-02 NOTE — ED Triage Notes (Signed)
Pt c/o having frequently HA mostly when she stands up and she is concern that has to do with her BP wants to be check. Denies any pain at this time.

## 2017-10-25 ENCOUNTER — Telehealth: Payer: Medicaid Other | Admitting: Physician Assistant

## 2017-10-25 DIAGNOSIS — R1110 Vomiting, unspecified: Secondary | ICD-10-CM

## 2017-10-25 MED ORDER — ONDANSETRON HCL 4 MG PO TABS: 4.0000 mg | ORAL_TABLET | Freq: Three times a day (TID) | ORAL | 0 refills | Status: DC | PRN

## 2017-10-25 NOTE — Progress Notes (Signed)
We are sorry that you are not feeling well. Here is how we plan to help!  Based on what you have shared with me it looks like you have vomiting from migraines.  Vomiting is the forceful emptying of a portion of the stomach's content through the mouth.  Although nausea and vomiting can make you feel miserable, it's important to remember that these are not diseases, but rather symptoms of an underlying illness.  When we treat short term symptoms, we always caution that any symptoms that persist should be fully evaluated in a medical office.  I have prescribed a medication that will help alleviate your symptoms and allow you to stay hydrated:  Zofran 4 mg 1 tablet every 8 hours as needed for nausea and vomiting  HOME CARE:  Drink clear liquids.  This is very important! Dehydration (the lack of fluid) can lead to a serious complication.  Start off with 1 tablespoon every 5 minutes for 8 hours.  You may begin eating bland foods after 8 hours without vomiting.  Start with saltine crackers, white bread, rice, mashed potatoes, applesauce.  After 48 hours on a bland diet, you may resume a normal diet.  Try to go to sleep.  Sleep often empties the stomach and relieves the need to vomit.  GET HELP RIGHT AWAY IF:   Your symptoms do not improve or worsen within 2 days after treatment.  You have a fever for over 3 days.  You cannot keep down fluids after trying the medication.  MAKE SURE YOU:   Understand these instructions.  Will watch your condition.  Will get help right away if you are not doing well or get worse.   Thank you for choosing an e-visit. Your e-visit answers were reviewed by a board certified advanced clinical practitioner to complete your personal care plan. Depending upon the condition, your plan could have included both over the counter or prescription medications. Please review your pharmacy choice. Be sure that the pharmacy you have chosen is open so that you can pick up  your prescription now.  If there is a problem you may message your provider in MyChart to have the prescription routed to another pharmacy. Your safety is important to us. If you have drug allergies check your prescription carefully.  For the next 24 hours, you can use MyChart to ask questions about today's visit, request a non-urgent call back, or ask for a work or school excuse from your e-visit provider. You will get an e-mail in the next two days asking about your experience. I hope that your e-visit has been valuable and will speed your recovery.

## 2018-01-26 ENCOUNTER — Other Ambulatory Visit: Payer: Self-pay

## 2018-01-26 ENCOUNTER — Emergency Department (HOSPITAL_BASED_OUTPATIENT_CLINIC_OR_DEPARTMENT_OTHER)
Admission: EM | Admit: 2018-01-26 | Discharge: 2018-01-26 | Disposition: A | Discharge status: Home / Self Care | Payer: Medicaid Other | Attending: Emergency Medicine | Admitting: Emergency Medicine

## 2018-01-26 ENCOUNTER — Encounter (HOSPITAL_BASED_OUTPATIENT_CLINIC_OR_DEPARTMENT_OTHER): Payer: Self-pay | Admitting: Emergency Medicine

## 2018-01-26 DIAGNOSIS — F17210 Nicotine dependence, cigarettes, uncomplicated: Secondary | ICD-10-CM | POA: Insufficient documentation

## 2018-01-26 DIAGNOSIS — Z113 Encounter for screening for infections with a predominantly sexual mode of transmission: Secondary | ICD-10-CM | POA: Insufficient documentation

## 2018-01-26 DIAGNOSIS — N898 Other specified noninflammatory disorders of vagina: Secondary | ICD-10-CM

## 2018-01-26 DIAGNOSIS — Z79899 Other long term (current) drug therapy: Secondary | ICD-10-CM | POA: Insufficient documentation

## 2018-01-26 LAB — WET PREP, GENITAL
SPERM: NONE SEEN
TRICH WET PREP: NONE SEEN
YEAST WET PREP: NONE SEEN

## 2018-01-26 LAB — URINALYSIS, ROUTINE W REFLEX MICROSCOPIC
Bilirubin Urine: NEGATIVE
GLUCOSE, UA: NEGATIVE mg/dL
Hgb urine dipstick: NEGATIVE
Ketones, ur: NEGATIVE mg/dL
LEUKOCYTES UA: NEGATIVE
NITRITE: NEGATIVE
PH: 6.5 (ref 5.0–8.0)
Protein, ur: NEGATIVE mg/dL
Specific Gravity, Urine: 1.015 (ref 1.005–1.030)

## 2018-01-26 LAB — PREGNANCY, URINE: Preg Test, Ur: NEGATIVE

## 2018-01-26 MED ORDER — LIDOCAINE HCL (PF) 1 % IJ SOLN
INTRAMUSCULAR | Status: AC
  Administered 2018-01-26: 2.5 mL
  Filled 2018-01-26: qty 5

## 2018-01-26 MED ORDER — METRONIDAZOLE 500 MG PO TABS: 500.0000 mg | ORAL_TABLET | Freq: Two times a day (BID) | ORAL | 0 refills | Status: DC

## 2018-01-26 MED ORDER — AZITHROMYCIN 250 MG PO TABS
1000.0000 mg | ORAL_TABLET | Freq: Once | ORAL | Status: AC
  Administered 2018-01-26: 1000 mg via ORAL
  Filled 2018-01-26: qty 4

## 2018-01-26 MED ORDER — CEFTRIAXONE SODIUM 250 MG IJ SOLR
250.0000 mg | Freq: Once | INTRAMUSCULAR | Status: AC
  Administered 2018-01-26: 250 mg via INTRAMUSCULAR
  Filled 2018-01-26: qty 250

## 2018-01-26 MED FILL — metroNIDAZOLE 500 MG TABS: 500 | 7 days supply | Qty: 14 | Fill #0

## 2018-01-26 NOTE — ED Triage Notes (Signed)
Reports having unprotected sex and now feels that she has bacterial vaginosis.  Reports odor.  Unsure of discharge. Requests full STD check.

## 2018-01-26 NOTE — ED Notes (Signed)
Pelvic cart at bedside. 

## 2018-01-26 NOTE — ED Provider Notes (Signed)
[facility] EMERGENCY DEPARTMENT Provider Note   CSN: [id number] Arrival date & time: 01/26/18  4098     History   Chief Complaint Chief Complaint  Patient presents with  . SEXUALLY TRANSMITTED DISEASE    HPI Tiffany Brewer is a 22 y.o. female.  Patient is a 22 year old female with history of bipolar, OCD.  She presents today for evaluation of vaginal discharge.  She reports unprotected sex with a new partner several days ago.  Since that time she has noticed discharge and odor.  She denies any fevers or chills.  Denies any abdominal pain.  The history is provided by the patient.    Past Medical History:  Diagnosis Date  . Bipolar disorder (HCC)   . Gestational diabetes   . OCD (obsessive compulsive disorder)     There are no active problems to display for this patient.   Past Surgical History:  Procedure Laterality Date  . INDUCED ABORTION       OB History   No obstetric history on file.      Home Medications    Prior to Admission medications   Medication Sig Start Date End Date Taking? Authorizing Provider  azithromycin (ZITHROMAX) 250 MG tablet Take four tabs once.  Eat something when you take the medication. 05/18/17   Isa Rankin, MD  cetirizine (ZYRTEC) 5 MG tablet Take 1 tablet (5 mg total) by mouth daily. 10/02/17   Gwyneth Sprout, MD  Glycopyrronium Tosylate (QBREXZA) 2.4 % PADS Apply 1 patch topically daily. 10/02/17   Gwyneth Sprout, MD  metroNIDAZOLE (FLAGYL) 500 MG tablet Take 1 tablet (500 mg total) by mouth 2 (two) times daily. Take with meals. DO NOT CONSUME ALCOHOL WHILE TAKING THIS MEDICATION. 05/17/17   Ofilia Neas, PA-C  NON FORMULARY     [provider]  ondansetron (ZOFRAN ODT) 4 MG disintegrating tablet Take 1 tablet (4 mg total) by mouth every 8 (eight) hours as needed for nausea or vomiting. 04/20/17   Horton, Mayer Masker, MD  ondansetron (ZOFRAN) 4 MG tablet Take 1 tablet (4 mg total) by mouth every 6 (six)  hours. 05/17/17   Ofilia Neas, PA-C  ondansetron (ZOFRAN) 4 MG tablet Take 1 tablet (4 mg total) by mouth every 8 (eight) hours as needed for nausea or vomiting. 10/25/17   Demetrio Lapping, PA-C  UNKNOWN TO PATIENT BCPs    [provider]    Family History Family History  Problem Relation Age of Onset  . Hypertension Mother     Social History Social History   Tobacco Use  . Smoking status: Current Every Day Smoker    Types: Cigarettes  . Smokeless tobacco: Never Used  Substance Use Topics  . Alcohol use: Yes    Comment: weekly  . Drug use: Yes    Types: Marijuana     Allergies   Codeine   Review of Systems Review of Systems  All other systems reviewed and are negative.    Physical Exam Updated Vital Signs BP (!) 156/113   Pulse 81   Temp 98.5 F (36.9 C) (Oral)   Resp 20   Ht 5\' 5"  (1.651 m)   Wt 81.6 kg   LMP 01/22/2018 (Approximate)   SpO2 100%   BMI 29.95 kg/m   Physical Exam Vitals signs and nursing note reviewed. Exam conducted with a chaperone present.  Constitutional:      General: She is not in acute distress.    Appearance: Normal appearance.  She is not ill-appearing.  HENT:     Head: Normocephalic and atraumatic.     Mouth/Throat:     Mouth: Mucous membranes are moist.  Cardiovascular:     Rate and Rhythm: Normal rate.  Pulmonary:     Effort: Pulmonary effort is normal.  Genitourinary:    Comments: There is a whitish discharge present.  There are no adnexal masses.  External genitalia appears normal.  There is no cervical motion tenderness. Skin:    General: Skin is warm and dry.  Neurological:     Mental Status: She is alert.      ED Treatments / Results  Labs (all labs ordered are listed, but only abnormal results are displayed) Labs Reviewed  WET PREP, GENITAL  URINALYSIS, ROUTINE W REFLEX MICROSCOPIC  PREGNANCY, URINE  GC/CHLAMYDIA PROBE AMP ([place]) NOT AT [facility]    EKG None  Radiology No results  found.  Procedures Procedures (including critical care time)  Medications Ordered in ED Medications - No data to display   Initial Impression / Assessment and Plan / ED Course  I have reviewed the triage vital signs and the nursing notes.  Pertinent labs & imaging results that were available during my care of the patient were reviewed by me and considered in my medical decision making (see chart for details).  Patient presents here with complaints of vaginal discharge and odor after unprotected intercourse 5 days ago with a new partner.  She is concerned about an STD and BV.  Her exam shows a whitish vaginal discharge.  Wet prep shows moderate white cells and clue cells.  Patient will be treated with Flagyl for BV.  She was also given the option of waiting for GC and Chlamydia cultures to return or presumptive treatment.  She has opted to be treated.  She was given Rocephin and Zithromax.  To return as needed for any problems.  Final Clinical Impressions(s) / ED Diagnoses   Final diagnoses:  None    ED Discharge Orders    None       Geoffery Lyonselo, Jasraj Lappe, MD 01/26/18 1127

## 2018-01-26 NOTE — ED Notes (Signed)
ED Provider at bedside. 

## 2018-01-26 NOTE — Discharge Instructions (Addendum)
Flagyl as prescribed.  We will call you if your cultures indicate you need to take further action.

## 2018-01-27 LAB — GC/CHLAMYDIA PROBE AMP (~~LOC~~) NOT AT ARMC
Chlamydia: NEGATIVE
NEISSERIA GONORRHEA: NEGATIVE

## 2018-05-15 LAB — LIPID PANEL
Cholesterol: 206 — AB (ref 0–200)
HDL: 68 (ref 35–70)
LDL Cholesterol: 117
Triglycerides: 106 (ref 40–160)

## 2018-07-15 ENCOUNTER — Emergency Department (HOSPITAL_BASED_OUTPATIENT_CLINIC_OR_DEPARTMENT_OTHER)
Admission: EM | Admit: 2018-07-15 | Discharge: 2018-07-15 | Disposition: A | Discharge status: Home / Self Care | Payer: Self-pay | Attending: Emergency Medicine | Admitting: Emergency Medicine

## 2018-07-15 ENCOUNTER — Emergency Department (HOSPITAL_BASED_OUTPATIENT_CLINIC_OR_DEPARTMENT_OTHER): Payer: Self-pay

## 2018-07-15 ENCOUNTER — Other Ambulatory Visit: Payer: Self-pay

## 2018-07-15 ENCOUNTER — Encounter (HOSPITAL_BASED_OUTPATIENT_CLINIC_OR_DEPARTMENT_OTHER): Payer: Self-pay

## 2018-07-15 DIAGNOSIS — Y929 Unspecified place or not applicable: Secondary | ICD-10-CM | POA: Insufficient documentation

## 2018-07-15 DIAGNOSIS — W108XXA Fall (on) (from) other stairs and steps, initial encounter: Secondary | ICD-10-CM | POA: Insufficient documentation

## 2018-07-15 DIAGNOSIS — F17210 Nicotine dependence, cigarettes, uncomplicated: Secondary | ICD-10-CM | POA: Insufficient documentation

## 2018-07-15 DIAGNOSIS — W19XXXA Unspecified fall, initial encounter: Secondary | ICD-10-CM

## 2018-07-15 DIAGNOSIS — Y9389 Activity, other specified: Secondary | ICD-10-CM | POA: Insufficient documentation

## 2018-07-15 DIAGNOSIS — M25571 Pain in right ankle and joints of right foot: Secondary | ICD-10-CM | POA: Insufficient documentation

## 2018-07-15 DIAGNOSIS — Y999 Unspecified external cause status: Secondary | ICD-10-CM | POA: Insufficient documentation

## 2018-07-15 NOTE — ED Triage Notes (Signed)
Pt states she stepped wrong and injured right ankle on 5/30-NAD-steady gait

## 2018-07-15 NOTE — ED Provider Notes (Signed)
[facility] EMERGENCY DEPARTMENT Provider Note   CSN: [id number] Arrival date & time: 07/15/18  1342  History   Chief Complaint Chief Complaint  Patient presents with  . Ankle Injury   HPI Tiffany Brewer is a 23 y.o. female with past medical history significant for bipolar disorder, OCD presents for evaluation after mechanical fall.  Patient states approximately 4 6 days ago she was walking down the stairs and missed the last step.  Patient states she inverted her right ankle.  Has had pain to her right lateral malleolus since her fall.  She denies hitting her head or LOC.  She is on anticoagulation.  Patient states she will invert her ankle did not fall onto her bottom.  She denies any radiation of pain.  Describes her pain as throbbing.  Patient states pain is worse when she ambulates.  She denies any pain to her bilateral hips, knee, 10/fibula, foot, decreased range of motion, numbness or tingling, bowel or bladder incontinence, saddle paresthesia, headache, unilateral weakness, chest pain, abdominal pain, diarrhea, dysuria.  She denies preceding auro, palpitations or dizziness. Has not taken anything for her pain. She rates her current pain a 0/10. Patient states "I just want to make sure it isnt broken." She has been ambulatory without difficulty since the incident. Denies additional aggravating or alleviating factors.  History obtained from patient. No interpretor was used.     HPI  Past Medical History:  Diagnosis Date  . Bipolar disorder (HCC)   . Gestational diabetes   . OCD (obsessive compulsive disorder)     There are no active problems to display for this patient.   Past Surgical History:  Procedure Laterality Date  . INDUCED ABORTION       OB History   No obstetric history on file.      Home Medications    Prior to Admission medications   Medication Sig Start Date End Date Taking? Authorizing Provider  cetirizine (ZYRTEC) 5 MG tablet Take 1 tablet (5  mg total) by mouth daily. 10/02/17   Gwyneth Sprout, MD  Glycopyrronium Tosylate (QBREXZA) 2.4 % PADS Apply 1 patch topically daily. 10/02/17   Gwyneth Sprout, MD  NON FORMULARY     [provider]  ondansetron (ZOFRAN ODT) 4 MG disintegrating tablet Take 1 tablet (4 mg total) by mouth every 8 (eight) hours as needed for nausea or vomiting. 04/20/17   Horton, Mayer Masker, MD  ondansetron (ZOFRAN) 4 MG tablet Take 1 tablet (4 mg total) by mouth every 6 (six) hours. 05/17/17   Ofilia Neas, PA-C  ondansetron (ZOFRAN) 4 MG tablet Take 1 tablet (4 mg total) by mouth every 8 (eight) hours as needed for nausea or vomiting. 10/25/17   Demetrio Lapping, PA-C  UNKNOWN TO PATIENT BCPs    [provider]    Family History Family History  Problem Relation Age of Onset  . Hypertension Mother     Social History Social History   Tobacco Use  . Smoking status: Current Every Day Smoker    Types: Cigarettes  . Smokeless tobacco: Never Used  Substance Use Topics  . Alcohol use: Yes    Comment: weekly  . Drug use: Yes    Types: Marijuana     Allergies   Codeine   Review of Systems Review of Systems  Constitutional: Negative.   HENT: Negative.   Respiratory: Negative.   Cardiovascular: Negative.   Gastrointestinal: Negative.   Genitourinary: Negative.   Musculoskeletal: Negative for  arthralgias, back pain, gait problem, joint swelling, myalgias, neck pain and neck stiffness.       Right ankle pain  Skin: Negative.   Neurological: Negative.   All other systems reviewed and are negative.   Physical Exam Updated Vital Signs BP (!) 150/104 (BP Location: Left Arm)   Pulse 74   Temp 98.3 F (36.8 C) (Oral)   Resp 18   Ht 5\' 5"  (1.651 m)   Wt 78.9 kg   LMP 06/22/2018 (Approximate)   SpO2 100%   BMI 28.96 kg/m   Physical Exam Vitals signs and nursing note reviewed.  Constitutional:      General: She is not in acute distress.    Appearance: She is  well-developed. She is not ill-appearing, toxic-appearing or diaphoretic.  HENT:     Head: Normocephalic and atraumatic.     Mouth/Throat:     Mouth: Mucous membranes are moist.     Pharynx: Oropharynx is clear.  Eyes:     Pupils: Pupils are equal, round, and reactive to light.  Neck:     Musculoskeletal: Normal range of motion.  Cardiovascular:     Rate and Rhythm: Normal rate.     Pulses: Normal pulses.          Dorsalis pedis pulses are 2+ on the right side and 2+ on the left side.       Posterior tibial pulses are 2+ on the right side and 2+ on the left side.     Heart sounds: Normal heart sounds. No murmur. No friction rub. No gallop.   Pulmonary:     Effort: Pulmonary effort is normal. No respiratory distress.     Breath sounds: Normal breath sounds. No stridor. No wheezing, rhonchi or rales.  Chest:     Chest wall: No tenderness.  Abdominal:     General: Bowel sounds are normal. There is no distension.     Tenderness: There is no abdominal tenderness. There is no right CVA tenderness, left CVA tenderness, guarding or rebound.  Musculoskeletal: Normal range of motion.     Right hip: Normal.     Left hip: Normal.     Right knee: Normal.     Left knee: Normal.     Right ankle: She exhibits normal range of motion, no swelling, no ecchymosis, no deformity, no laceration and normal pulse. Tenderness. Lateral malleolus and AITFL tenderness found. No medial malleolus, no CF ligament, no posterior TFL, no head of 5th metatarsal and no proximal fibula tenderness found. Achilles tendon exhibits no pain, no defect and normal Thompson's test results.     Left ankle: Normal.     Lumbar back: Normal.     Right foot: Normal. No deformity or foot drop.     Left foot: Normal. No deformity or foot drop.       Feet:     Comments: Lower extremity compartments are soft.  No tenderness over bilateral calves.  Negative Janee Morn, Homans sign.  Range of motion bilateral lower extremities without  difficulty.  No tenderness to lumbar spine.  Ambulatory without difficulty.  Feet:     Right foot:     Protective Sensation: 2 sites tested. 2 sites sensed.     Skin integrity: Skin integrity normal.     Toenail Condition: Right toenails are normal.     Left foot:     Protective Sensation: 2 sites tested. 2 sites sensed.     Skin integrity: Skin integrity normal.  Toenail Condition: Left toenails are normal.  Skin:    General: Skin is warm and dry.     Capillary Refill: Capillary refill takes less than 2 seconds.     Comments: Brisk capillary refill.  No lacerations, rashes, wounds, bruising, ecchymosis or evidence of injury.  Neurological:     General: No focal deficit present.     Mental Status: She is alert.     Cranial Nerves: Cranial nerves are intact.     Sensory: Sensation is intact.     Motor: Motor function is intact.     Gait: Gait is intact.     Comments: 5/5 strength bilateral lower extremities without difficulty.  Ambulatory without difficulty.    ED Treatments / Results  Labs (all labs ordered are listed, but only abnormal results are displayed) Labs Reviewed - No data to display  EKG None  Radiology Dg Ankle Complete Right  Result Date: 07/15/2018 CLINICAL DATA:  Right ankle pain, pain and swelling laterally EXAM: RIGHT ANKLE - COMPLETE 3+ VIEW COMPARISON:  None. FINDINGS: There is no evidence of fracture, dislocation, or joint effusion. There is no evidence of arthropathy or other focal bone abnormality. Soft tissues are unremarkable. IMPRESSION: No acute osseous injury of the right ankle. Electronically Signed   By: Elige KoHetal  Patel   On: 07/15/2018 14:13    Procedures Procedures (including critical care time)  Medications Ordered in ED Medications - No data to display   Initial Impression / Assessment and Plan / ED Course  I have reviewed the triage vital signs and the nursing notes.  Pertinent labs & imaging results that were available during my care  of the patient were reviewed by me and considered in my medical decision making (see chart for details).  23 year old female appears otherwise well presents for evaluation after mechanical fall which occurred 5 days PTA.  She is afebrile, nonseptic, non-ill-appearing.  Patient denies hitting head or LOC.  Patient with fall which she inverted her right ankle.  She has been ambulatory since the incident.  She has no current pain.  She has a normal musculoskeletal exam.  She is neurovascularly intact.  She has no tenderness above or below the site of injury.  She does have some mild tenderness to her ATFL and her lateral malleolus.  Is full range of motion without difficulty.  Ambulatory in ED without difficulty.  She has not taken anything at home for her pain.  Patient's main concern is that she has broken something.  No overlying skin changes.  No edema, erythema, ecchymosis or warmth.  No rashes or lesions. Plain film negative for right ankle negative for fracture or dislocation.  She has no effusion.  She has no tenderness to her tibia/fibula or foot. Stable hip and pelvis. No evidence of septic joint, gout, hemarthrosis, laceration, dislocation, fracture.  He appears overall well.  Will place patient in ASO brace, rice for symptomatic management.  Will patient follow-up with orthopedics if she continues to have pain. Possible sprain or strain. Lower extremity compartments are soft.  NV intact after splint placed.  She has had some mild hypertension in ED.  Discussed with patient follow-up with PCP for reevaluation.  Low suspicion for hypertensive urgency or hypertensive urgency.  She has no headache, nausea, vomiting, chest pain, shortness of breath, unilateral weakness or abdominal pain.   Patient is hemodynamically stable and in no acute distress.  Patient able to ambulate in department prior to ED.  Evaluation does not show acute  pathology that would require ongoing or additional emergent interventions  while in the emergency department or further inpatient treatment.  I have discussed the diagnosis with the patient and answered all questions.  Patient has no further complaints prior to discharge.  Patient is comfortable with plan discussed in room and is stable for discharge at this time.  I have discussed strict return precautions for returning to the emergency department.  Patient was encouraged to follow-up with PCP/specialist refer to at discharge.     Final Clinical Impressions(s) / ED Diagnoses   Final diagnoses:  Acute right ankle pain  Fall, initial encounter    ED Discharge Orders    None       Linwood Dibbles, PA-C 07/15/18 1447    Azalia Bilis, MD 07/15/18 [phone]

## 2018-07-15 NOTE — Discharge Instructions (Signed)
Evaluated today after fall with ankle pain. Xray negative for fracture or dislocation. Likely sprain or strain and placed in ASO brace. Take Tylenol or Ibuprofen as needed for pain. Follow up with Orthopedics for re evaluation.  Return to the ED for any new or worsening symptoms.

## 2018-07-15 NOTE — ED Notes (Signed)
ED Provider at bedside. 

## 2018-07-15 NOTE — ED Notes (Signed)
Patient transported to X-ray 

## 2018-08-04 ENCOUNTER — Emergency Department (HOSPITAL_BASED_OUTPATIENT_CLINIC_OR_DEPARTMENT_OTHER)
Admission: EM | Admit: 2018-08-04 | Discharge: 2018-08-04 | Disposition: A | Discharge status: Home / Self Care | Payer: Medicaid Other | Attending: Emergency Medicine | Admitting: Emergency Medicine

## 2018-08-04 ENCOUNTER — Other Ambulatory Visit: Payer: Self-pay

## 2018-08-04 ENCOUNTER — Emergency Department (HOSPITAL_BASED_OUTPATIENT_CLINIC_OR_DEPARTMENT_OTHER): Payer: Medicaid Other

## 2018-08-04 ENCOUNTER — Encounter (HOSPITAL_BASED_OUTPATIENT_CLINIC_OR_DEPARTMENT_OTHER): Payer: Self-pay

## 2018-08-04 DIAGNOSIS — R0789 Other chest pain: Secondary | ICD-10-CM | POA: Insufficient documentation

## 2018-08-04 DIAGNOSIS — F1210 Cannabis abuse, uncomplicated: Secondary | ICD-10-CM | POA: Insufficient documentation

## 2018-08-04 DIAGNOSIS — Z20828 Contact with and (suspected) exposure to other viral communicable diseases: Secondary | ICD-10-CM | POA: Insufficient documentation

## 2018-08-04 DIAGNOSIS — R0602 Shortness of breath: Secondary | ICD-10-CM | POA: Insufficient documentation

## 2018-08-04 DIAGNOSIS — Z79899 Other long term (current) drug therapy: Secondary | ICD-10-CM | POA: Insufficient documentation

## 2018-08-04 DIAGNOSIS — Z8632 Personal history of gestational diabetes: Secondary | ICD-10-CM | POA: Insufficient documentation

## 2018-08-04 DIAGNOSIS — F17210 Nicotine dependence, cigarettes, uncomplicated: Secondary | ICD-10-CM | POA: Insufficient documentation

## 2018-08-04 LAB — D-DIMER, QUANTITATIVE: D-Dimer, Quant: 0.49 ug/mL-FEU (ref 0.00–0.50)

## 2018-08-04 MED ORDER — IBUPROFEN 800 MG PO TABS
800.0000 mg | ORAL_TABLET | Freq: Once | ORAL | Status: AC
  Administered 2018-08-04: 15:00:00 800 mg via ORAL
  Filled 2018-08-04: qty 1

## 2018-08-04 MED ORDER — HYDROXYZINE HCL 25 MG PO TABS: 25.0000 mg | ORAL_TABLET | Freq: Three times a day (TID) | ORAL | 0 refills | Status: DC | PRN

## 2018-08-04 NOTE — ED Triage Notes (Signed)
C/o chest tightness x 2 days-denies fever, cough-states she has "mucus in the back of my throat that won't come up"-NAD-steady gait

## 2018-08-04 NOTE — ED Provider Notes (Signed)
[facility] EMERGENCY DEPARTMENT Provider Note   CSN: [id number] Arrival date & time: 08/04/18  1334    History   Chief Complaint Chief Complaint  Patient presents with  . Chest Pain    HPI Tiffany Johnsorcha Fischbach is a 23 y.o. female.     The history is provided by the patient and medical records. No language interpreter was used.  Chest Pain  Tiffany Johnsorcha Zilberman is a 23 y.o. female who presents to the Emergency Department complaining of chest pain. Presents to the emergency department complaining of chest pain that began yesterday. Is described as a tightness in heaviness. It is constant nature. It woke her from sleep multiple times last night. She has mild associated shortness of breath. She feels like she cannot take a deep breath. She also feels like there is mucus in the back of her throat. She denies any fevers, cough, nausea, vomiting, abdominal pain, diarrhea. She does use OCPs and smoke cigarettes. She has no medical problems. No history of DVT or PE. She works as a Associate Professorpharmacy tech. She is around sick people but has no known coronavirus exposures. No change in taste or smell. As are moderate and constant nature. No prior similar symptoms. Past Medical History:  Diagnosis Date  . Bipolar disorder (HCC)   . Gestational diabetes   . OCD (obsessive compulsive disorder)     There are no active problems to display for this patient.   Past Surgical History:  Procedure Laterality Date  . INDUCED ABORTION       OB History   No obstetric history on file.      Home Medications    Prior to Admission medications   Medication Sig Start Date End Date Taking? Authorizing Provider  cetirizine (ZYRTEC) 5 MG tablet Take 1 tablet (5 mg total) by mouth daily. 10/02/17   Gwyneth SproutPlunkett, Whitney, MD  Glycopyrronium Tosylate (QBREXZA) 2.4 % PADS Apply 1 patch topically daily. 10/02/17   Gwyneth SproutPlunkett, Whitney, MD  hydrOXYzine (ATARAX/VISTARIL) 25 MG tablet Take 1 tablet (25 mg total) by mouth every 8  (eight) hours as needed. 08/04/18   Tilden Fossaees, Margarit Minshall, MD  NON FORMULARY     [provider]  ondansetron (ZOFRAN ODT) 4 MG disintegrating tablet Take 1 tablet (4 mg total) by mouth every 8 (eight) hours as needed for nausea or vomiting. 04/20/17   Horton, Mayer Maskerourtney F, MD  ondansetron (ZOFRAN) 4 MG tablet Take 1 tablet (4 mg total) by mouth every 6 (six) hours. 05/17/17   Ofilia Neaslark, Michael L, PA-C  ondansetron (ZOFRAN) 4 MG tablet Take 1 tablet (4 mg total) by mouth every 8 (eight) hours as needed for nausea or vomiting. 10/25/17   Demetrio Lappingsman, Sahar M, PA-C  UNKNOWN TO PATIENT BCPs    [provider]    Family History Family History  Problem Relation Age of Onset  . Hypertension Mother     Social History Social History   Tobacco Use  . Smoking status: Current Every Day Smoker    Types: Cigarettes  . Smokeless tobacco: Never Used  Substance Use Topics  . Alcohol use: Yes    Comment: weekly  . Drug use: Yes    Types: Marijuana     Allergies   Codeine   Review of Systems Review of Systems  Cardiovascular: Positive for chest pain.  All other systems reviewed and are negative.    Physical Exam Updated Vital Signs BP (!) 146/84 (BP Location: Right Arm)   Pulse 68   Temp 98.3  F (36.8 C) (Oral)   Resp 18   Ht 5\' 5"  (1.651 m)   Wt 81.2 kg   LMP 07/28/2018   SpO2 100%   BMI 29.79 kg/m   Physical Exam Vitals signs and nursing note reviewed.  Constitutional:      Appearance: She is well-developed.  HENT:     Head: Normocephalic and atraumatic.  Cardiovascular:     Rate and Rhythm: Normal rate and regular rhythm.     Heart sounds: No murmur.  Pulmonary:     Effort: Pulmonary effort is normal. No respiratory distress.     Breath sounds: Normal breath sounds.  Chest:     Chest wall: No tenderness.  Abdominal:     Palpations: Abdomen is soft.     Tenderness: There is no abdominal tenderness. There is no guarding or rebound.  Musculoskeletal:        General:  No swelling or tenderness.  Skin:    General: Skin is warm and dry.  Neurological:     Mental Status: She is alert and oriented to person, place, and time.  Psychiatric:        Mood and Affect: Mood normal.        Behavior: Behavior normal.      ED Treatments / Results  Labs (all labs ordered are listed, but only abnormal results are displayed) Labs Reviewed  NOVEL CORONAVIRUS, NAA (HOSPITAL ORDER, SEND-OUT TO REF LAB)  D-DIMER, QUANTITATIVE (NOT AT [facility])    EKG EKG Interpretation  Date/Time:  Wednesday August 04 2018 13:44:35 EDT Ventricular Rate:  66 PR Interval:    QRS Duration: 93 QT Interval:  404 QTC Calculation: 424 R Axis:   87 Text Interpretation:  Sinus rhythm Baseline wander in lead(s) V6 Confirmed by Quintella Reichert [phone]) on 08/04/2018 1:57:52 PM   Radiology Dg Chest Portable 1 View  Result Date: 08/04/2018 CLINICAL DATA:  Chest and back pain.  Smoker. EXAM: PORTABLE CHEST 1 VIEW COMPARISON:  01/30/2017 and 12/01/2016. FINDINGS: 1359 hours. The heart size and mediastinal contours are normal. The lungs are clear. There is no pleural effusion or pneumothorax. No acute osseous findings are identified. Telemetry leads overlie the chest. IMPRESSION: Stable chest.  No active cardiopulmonary process. Electronically Signed   By: Richardean Sale M.D.   On: 08/04/2018 14:42    Procedures Procedures (including critical care time)  Medications Ordered in ED Medications  ibuprofen (ADVIL) tablet 800 mg (800 mg Oral Given 08/04/18 1443)     Initial Impression / Assessment and Plan / ED Course  I have reviewed the triage vital signs and the nursing notes.  Pertinent labs & imaging results that were available during my care of the patient were reviewed by me and considered in my medical decision making (see chart for details).        Patient here for evaluation of two days of chest pain. She is non-toxic appearing on evaluation. EKG without acute ischemic changes,  presentation is not consistent with ACS, dissection. She does use oral contraceptives as well as smokes cigarettes. D dimer was obtained, which was negative. Doubt PE. Chest x-ray without evidence of pneumonia. She does have multiple risk factors for COVID-19 infection, including working as a Occupational psychologist, recent travel to [place] last week, as well as being told that a Dealer that worked on her car was diagnosed with COVID-19 a few days after working on her vehicle. Will send COVID-19 swab. Discussed with patient home care for chest pain. Discussed  outpatient follow-up and return precautions.  Tiffany Brewer was evaluated in Emergency Department on 08/04/2018 for the symptoms described in the history of present illness. She was evaluated in the context of the global COVID-19 pandemic, which necessitated consideration that the patient might be at risk for infection with the SARS-CoV-2 virus that causes COVID-19. Institutional protocols and algorithms that pertain to the evaluation of patients at risk for COVID-19 are in a state of rapid change based on information released by regulatory bodies including the CDC and federal and state organizations. These policies and algorithms were followed during the patient's care in the ED.   Final Clinical Impressions(s) / ED Diagnoses   Final diagnoses:  Atypical chest pain    ED Discharge Orders         Ordered    hydrOXYzine (ATARAX/VISTARIL) 25 MG tablet  Every 8 hours PRN     08/04/18 1505           Tilden Fossaees, Lilyahna Sirmon, MD 08/04/18 1530

## 2018-08-05 LAB — NOVEL CORONAVIRUS, NAA (HOSP ORDER, SEND-OUT TO REF LAB; TAT 18-24 HRS): SARS-CoV-2, NAA: NOT DETECTED

## 2018-09-05 ENCOUNTER — Encounter (HOSPITAL_BASED_OUTPATIENT_CLINIC_OR_DEPARTMENT_OTHER): Payer: Self-pay

## 2018-09-05 ENCOUNTER — Emergency Department (HOSPITAL_BASED_OUTPATIENT_CLINIC_OR_DEPARTMENT_OTHER)
Admission: EM | Admit: 2018-09-05 | Discharge: 2018-09-05 | Disposition: A | Discharge status: Home / Self Care | Payer: Medicaid Other | Attending: Emergency Medicine | Admitting: Emergency Medicine

## 2018-09-05 ENCOUNTER — Other Ambulatory Visit: Payer: Self-pay

## 2018-09-05 DIAGNOSIS — N760 Acute vaginitis: Secondary | ICD-10-CM | POA: Insufficient documentation

## 2018-09-05 DIAGNOSIS — F17210 Nicotine dependence, cigarettes, uncomplicated: Secondary | ICD-10-CM | POA: Insufficient documentation

## 2018-09-05 DIAGNOSIS — B9689 Other specified bacterial agents as the cause of diseases classified elsewhere: Secondary | ICD-10-CM

## 2018-09-05 DIAGNOSIS — Z79899 Other long term (current) drug therapy: Secondary | ICD-10-CM | POA: Insufficient documentation

## 2018-09-05 LAB — WET PREP, GENITAL
Sperm: NONE SEEN
Trich, Wet Prep: NONE SEEN
Yeast Wet Prep HPF POC: NONE SEEN

## 2018-09-05 LAB — URINALYSIS, ROUTINE W REFLEX MICROSCOPIC
Bilirubin Urine: NEGATIVE
Glucose, UA: NEGATIVE mg/dL
Hgb urine dipstick: NEGATIVE
Ketones, ur: NEGATIVE mg/dL
Leukocytes,Ua: NEGATIVE
Nitrite: NEGATIVE
Protein, ur: NEGATIVE mg/dL
Specific Gravity, Urine: 1.02 (ref 1.005–1.030)
pH: 8.5 — ABNORMAL HIGH (ref 5.0–8.0)

## 2018-09-05 LAB — PREGNANCY, URINE: Preg Test, Ur: NEGATIVE

## 2018-09-05 MED ORDER — CEFTRIAXONE SODIUM 250 MG IJ SOLR
250.0000 mg | Freq: Once | INTRAMUSCULAR | Status: AC
  Administered 2018-09-05: 250 mg via INTRAMUSCULAR
  Filled 2018-09-05: qty 250

## 2018-09-05 MED ORDER — FLUCONAZOLE 200 MG PO TABS: 200.0000 mg | ORAL_TABLET | Freq: Every day | ORAL | 0 refills | Status: AC

## 2018-09-05 MED ORDER — AZITHROMYCIN 250 MG PO TABS
1000.0000 mg | ORAL_TABLET | Freq: Once | ORAL | Status: AC
  Administered 2018-09-05: 1000 mg via ORAL
  Filled 2018-09-05: qty 4

## 2018-09-05 MED ORDER — METRONIDAZOLE 0.75 % VA GEL: 1.0000 | Freq: Two times a day (BID) | VAGINAL | 0 refills | Status: DC

## 2018-09-05 NOTE — ED Provider Notes (Signed)
[facility] EMERGENCY DEPARTMENT Provider Note   CSN: [id number] Arrival date & time: 09/05/18  1204     History   Chief Complaint Chief Complaint  Patient presents with  . Vaginal Discharge    HPI Tiffany Brewer is a 23 y.o. female.     The history is provided by the patient.  Vaginal Discharge Quality:  White Severity:  Moderate Onset quality:  Gradual Duration:  1 week Timing:  Constant Progression:  Unchanged Chronicity:  Recurrent Context: spontaneously   Context: not during pregnancy and not recent antibiotic use   Relieved by:  Nothing Worsened by:  Nothing Ineffective treatments: Tried the MetroGel for the last 5 days which initially thought that there was improvement but then the smell returned when she stopped using the gel. Associated symptoms: no abdominal pain, no dyspareunia, no dysuria, no genital lesions, no urinary frequency, no vaginal itching and no vomiting   Risk factors comment:  Prior history of BV   Past Medical History:  Diagnosis Date  . Bipolar disorder (HCC)   . Gestational diabetes   . OCD (obsessive compulsive disorder)     There are no active problems to display for this patient.   Past Surgical History:  Procedure Laterality Date  . INDUCED ABORTION       OB History   No obstetric history on file.      Home Medications    Prior to Admission medications   Medication Sig Start Date End Date Taking? Authorizing Provider  cetirizine (ZYRTEC) 5 MG tablet Take 1 tablet (5 mg total) by mouth daily. 10/02/17   Gwyneth SproutPlunkett, Sharmel Ballantine, MD  Glycopyrronium Tosylate (QBREXZA) 2.4 % PADS Apply 1 patch topically daily. 10/02/17   Gwyneth SproutPlunkett, Ruthie Berch, MD  hydrOXYzine (ATARAX/VISTARIL) 25 MG tablet Take 1 tablet (25 mg total) by mouth every 8 (eight) hours as needed. 08/04/18   Tilden Fossaees, Elizabeth, MD  NON FORMULARY     [provider]  ondansetron (ZOFRAN ODT) 4 MG disintegrating tablet Take 1 tablet (4 mg total) by mouth every 8  (eight) hours as needed for nausea or vomiting. 04/20/17   Horton, Mayer Maskerourtney F, MD  ondansetron (ZOFRAN) 4 MG tablet Take 1 tablet (4 mg total) by mouth every 6 (six) hours. 05/17/17   Ofilia Neaslark, Michael L, PA-C  ondansetron (ZOFRAN) 4 MG tablet Take 1 tablet (4 mg total) by mouth every 8 (eight) hours as needed for nausea or vomiting. 10/25/17   Demetrio Lappingsman, Sahar M, PA-C  UNKNOWN TO PATIENT BCPs    [provider]    Family History Family History  Problem Relation Age of Onset  . Hypertension Mother     Social History Social History   Tobacco Use  . Smoking status: Current Every Day Smoker    Types: Cigarettes  . Smokeless tobacco: Never Used  Substance Use Topics  . Alcohol use: Yes    Comment: weekly  . Drug use: Yes    Types: Marijuana     Allergies   Codeine   Review of Systems Review of Systems  Gastrointestinal: Negative for abdominal pain and vomiting.  Genitourinary: Positive for vaginal discharge. Negative for dyspareunia and dysuria.  All other systems reviewed and are negative.    Physical Exam Updated Vital Signs BP (!) 146/103 (BP Location: Right Arm)   Pulse 80   Temp 98.5 F (36.9 C) (Oral)   Resp 18   LMP 08/11/2018   SpO2 100%   Physical Exam Vitals signs and nursing note reviewed.  Constitutional:      General: She is not in acute distress.    Appearance: She is well-developed.  HENT:     Head: Normocephalic and atraumatic.  Eyes:     Conjunctiva/sclera: Conjunctivae normal.     Pupils: Pupils are equal, round, and reactive to light.  Neck:     Musculoskeletal: Normal range of motion and neck supple.  Cardiovascular:     Rate and Rhythm: Normal rate.  Pulmonary:     Effort: Pulmonary effort is normal. No respiratory distress.  Abdominal:     General: There is no distension.     Palpations: Abdomen is soft.     Tenderness: There is no abdominal tenderness. There is no guarding or rebound.  Genitourinary:    Labia:        Right: No  rash, tenderness or lesion.        Left: No rash, tenderness or lesion.      Vagina: Vaginal discharge present.     Cervix: Discharge present. No cervical motion tenderness.     Uterus: Normal.      Comments: Thick white discharge present in the vaginal vault Musculoskeletal: Normal range of motion.        General: No tenderness.  Skin:    General: Skin is warm and dry.     Findings: No erythema or rash.  Neurological:     Mental Status: She is alert and oriented to person, place, and time.  Psychiatric:        Behavior: Behavior normal.      ED Treatments / Results  Labs (all labs ordered are listed, but only abnormal results are displayed) Labs Reviewed  WET PREP, GENITAL - Abnormal; Notable for the following components:      Result Value   Clue Cells Wet Prep HPF POC PRESENT (*)    WBC, Wet Prep HPF POC FEW (*)    All other components within normal limits  URINALYSIS, ROUTINE W REFLEX MICROSCOPIC - Abnormal; Notable for the following components:   pH 8.5 (*)    All other components within normal limits  PREGNANCY, URINE  RPR  HIV ANTIBODY (ROUTINE TESTING W REFLEX)  GC/CHLAMYDIA PROBE AMP ([place]) NOT AT [facility]    EKG None  Radiology No results found.  Procedures Procedures (including critical care time)  Medications Ordered in ED Medications - No data to display   Initial Impression / Assessment and Plan / ED Course  I have reviewed the triage vital signs and the nursing notes.  Pertinent labs & imaging results that were available during my care of the patient were reviewed by me and considered in my medical decision making (see chart for details).        Patient presenting today complaining of vaginal discharge for the last week with an odor.  She has been using MetroGel and initially thought that it helped but when she stopped the odor returned.  She denies any dysuria, frequency or urgency but she has had a new sexual partner for the last few  months and was does not use protection regularly.  Last menses was this month.  She denies any abdominal pain or systemic symptoms.  On exam she has no pelvic tenderness or concern for PID.  She does have some thick white discharge more concerning for yeast.  Wet prep is consistent with BV but may also treat for yeast.  Will offer patient STD treatment prophylactically.  UA without acute findings and UPT neg.  patient request to get the antibiotics here and was given Rocephin and azithromycin.  She was discharged home with MetroGel and fluconazole.  Final Clinical Impressions(s) / ED Diagnoses   Final diagnoses:  BV (bacterial vaginosis)    ED Discharge Orders         Ordered    metroNIDAZOLE (METROGEL VAGINAL) 0.75 % vaginal gel  2 times daily     09/05/18 1336    fluconazole (DIFLUCAN) 200 MG tablet  Daily     09/05/18 1336           Gwyneth SproutPlunkett, Jamail Cullers, MD 09/05/18 1336

## 2018-09-05 NOTE — ED Notes (Signed)
Pelvic exam set up 

## 2018-09-05 NOTE — ED Notes (Signed)
Pt requesting not to stay entire shot time because "I've had these before"

## 2018-09-05 NOTE — ED Triage Notes (Signed)
Pt reports hx of BV. Reports discharge x 1 week. Started taking old Flagyl gel prescription, with no relief of symptoms. Sts flagylpills make her chest hurt when taking them. Also requesting STD testing.

## 2018-09-05 NOTE — Discharge Instructions (Signed)
Avoid sexual contact until your test results returned and your symptoms resolve

## 2018-09-06 LAB — RPR: RPR Ser Ql: NONREACTIVE

## 2018-09-06 LAB — HIV ANTIBODY (ROUTINE TESTING W REFLEX): HIV Screen 4th Generation wRfx: NONREACTIVE

## 2018-09-07 LAB — GC/CHLAMYDIA PROBE AMP (~~LOC~~) NOT AT ARMC
Chlamydia: NEGATIVE
Neisseria Gonorrhea: NEGATIVE

## 2019-05-14 ENCOUNTER — Other Ambulatory Visit: Payer: Self-pay

## 2019-05-14 ENCOUNTER — Emergency Department (HOSPITAL_BASED_OUTPATIENT_CLINIC_OR_DEPARTMENT_OTHER)
Admission: EM | Admit: 2019-05-14 | Discharge: 2019-05-14 | Disposition: A | Discharge status: Home / Self Care | Payer: Medicaid Other | Attending: Emergency Medicine | Admitting: Emergency Medicine

## 2019-05-14 ENCOUNTER — Encounter (HOSPITAL_BASED_OUTPATIENT_CLINIC_OR_DEPARTMENT_OTHER): Payer: Self-pay

## 2019-05-14 DIAGNOSIS — B9689 Other specified bacterial agents as the cause of diseases classified elsewhere: Secondary | ICD-10-CM

## 2019-05-14 DIAGNOSIS — N760 Acute vaginitis: Secondary | ICD-10-CM | POA: Insufficient documentation

## 2019-05-14 DIAGNOSIS — F17210 Nicotine dependence, cigarettes, uncomplicated: Secondary | ICD-10-CM | POA: Insufficient documentation

## 2019-05-14 DIAGNOSIS — Z8632 Personal history of gestational diabetes: Secondary | ICD-10-CM | POA: Insufficient documentation

## 2019-05-14 LAB — CBC WITH DIFFERENTIAL/PLATELET
Abs Immature Granulocytes: 0.01 10*3/uL (ref 0.00–0.07)
Basophils Absolute: 0 10*3/uL (ref 0.0–0.1)
Basophils Relative: 1 %
Eosinophils Absolute: 0.1 10*3/uL (ref 0.0–0.5)
Eosinophils Relative: 2 %
HCT: 44.6 % (ref 36.0–46.0)
Hemoglobin: 14.8 g/dL (ref 12.0–15.0)
Immature Granulocytes: 0 %
Lymphocytes Relative: 42 %
Lymphs Abs: 2.1 10*3/uL (ref 0.7–4.0)
MCH: 29.4 pg (ref 26.0–34.0)
MCHC: 33.2 g/dL (ref 30.0–36.0)
MCV: 88.5 fL (ref 80.0–100.0)
Monocytes Absolute: 0.4 10*3/uL (ref 0.1–1.0)
Monocytes Relative: 9 %
Neutro Abs: 2.4 10*3/uL (ref 1.7–7.7)
Neutrophils Relative %: 46 %
Platelets: 302 10*3/uL (ref 150–400)
RBC: 5.04 MIL/uL (ref 3.87–5.11)
RDW: 14 % (ref 11.5–15.5)
WBC: 5.1 10*3/uL (ref 4.0–10.5)
nRBC: 0 % (ref 0.0–0.2)

## 2019-05-14 LAB — WET PREP, GENITAL
Sperm: NONE SEEN
Trich, Wet Prep: NONE SEEN
Yeast Wet Prep HPF POC: NONE SEEN

## 2019-05-14 LAB — URINALYSIS, ROUTINE W REFLEX MICROSCOPIC
Bilirubin Urine: NEGATIVE
Glucose, UA: NEGATIVE mg/dL
Hgb urine dipstick: NEGATIVE
Ketones, ur: NEGATIVE mg/dL
Leukocytes,Ua: NEGATIVE
Nitrite: NEGATIVE
Protein, ur: NEGATIVE mg/dL
Specific Gravity, Urine: >1.03 — ABNORMAL HIGH (ref 1.005–1.030)
pH: 5.5 (ref 5.0–8.0)

## 2019-05-14 LAB — PREGNANCY, URINE: Preg Test, Ur: NEGATIVE

## 2019-05-14 LAB — BASIC METABOLIC PANEL
Anion gap: 11 (ref 5–15)
BUN: 8 mg/dL (ref 6–20)
CO2: 24 mmol/L (ref 22–32)
Calcium: 9.4 mg/dL (ref 8.9–10.3)
Chloride: 104 mmol/L (ref 98–111)
Creatinine, Ser: 0.8 mg/dL (ref 0.44–1.00)
GFR calc Af Amer: >60 mL/min (ref >60)
GFR calc non Af Amer: >60 mL/min (ref >60)
Glucose, Bld: 93 mg/dL (ref 70–99)
Potassium: 3.8 mmol/L (ref 3.5–5.1)
Sodium: 139 mmol/L (ref 135–145)

## 2019-05-14 LAB — HIV ANTIBODY (ROUTINE TESTING W REFLEX): HIV Screen 4th Generation wRfx: NONREACTIVE

## 2019-05-14 MED ORDER — METRONIDAZOLE 0.75 % VA GEL: 1.0000 | Freq: Every day | VAGINAL | 0 refills | Status: AC

## 2019-05-14 NOTE — Discharge Instructions (Addendum)
Insert 1 applicatorful of the metronidazole gel intravaginally each night for the next 5 nights. Follow-up with OB/GYN for any further management of this complaint.  May use ibuprofen, naproxen, or Tylenol for pain.

## 2019-05-14 NOTE — ED Notes (Signed)
ED Provider at bedside. 

## 2019-05-14 NOTE — ED Triage Notes (Signed)
Pt arrives ambulatory with lower abdominal pain for a week. Pt also reports extra discharge. Pt reports this is similar to her BV without the odor.

## 2019-05-14 NOTE — ED Provider Notes (Signed)
[facility] EMERGENCY DEPARTMENT Provider Note   CSN: [id number] Arrival date & time: 05/14/19  5009     History Chief Complaint  Patient presents with  . Abdominal Pain    Tiffany Brewer is a 24 y.o. female.  HPI      Tiffany Brewer is a 24 y.o. female, with a history of bipolar, OCD, chlamydia, BV, presenting to the ED with lower abdominal cramping for the last week. Pain is intermittent, suprapubic, 3/10, typically occurs when she is standing, accompanied by pain in the right lower back, lasts for a few minutes at a time before resolving. She also notes increased, white vaginal discharge that she states is similar to previous instances of BV, however, is not accompanied by abnormal odor.   She was last treated for BV about a month ago. LMP May 06, 2019. States she has an intermittent, female sex partner.   Denies fever/chills, chest pain, cough, shortness of breath, urinary symptoms, vaginal bleeding, N/V/C/D, or any other complaints.  OBGYN: Dr. Micah Noel   Past Medical History:  Diagnosis Date  . Bipolar disorder ([place])   . Gestational diabetes   . OCD (obsessive compulsive disorder)     There are no problems to display for this patient.   Past Surgical History:  Procedure Laterality Date  . INDUCED ABORTION       OB History   No obstetric history on file.     Family History  Problem Relation Age of Onset  . Hypertension Mother     Social History   Tobacco Use  . Smoking status: Current Every Day Smoker    Types: Cigarettes  . Smokeless tobacco: Never Used  Substance Use Topics  . Alcohol use: Yes    Comment: weekly  . Drug use: Yes    Types: Marijuana    Home Medications Prior to Admission medications   Medication Sig Start Date End Date Taking? Authorizing Provider  cetirizine (ZYRTEC) 5 MG tablet Take 1 tablet (5 mg total) by mouth daily. 10/02/17   Blanchie Dessert, MD  Glycopyrronium Tosylate (QBREXZA) 2.4 % PADS Apply 1 patch  topically daily. 10/02/17   Blanchie Dessert, MD  hydrOXYzine (ATARAX/VISTARIL) 25 MG tablet Take 1 tablet (25 mg total) by mouth every 8 (eight) hours as needed. 08/04/18   Quintella Reichert, MD  metroNIDAZOLE (METROGEL) 0.75 % vaginal gel Place 1 Applicatorful vaginally at bedtime for 5 days. 05/14/19 05/19/19  Elisah Parmer, Helane Gunther, PA-C  NON FORMULARY     [provider]  ondansetron (ZOFRAN ODT) 4 MG disintegrating tablet Take 1 tablet (4 mg total) by mouth every 8 (eight) hours as needed for nausea or vomiting. 04/20/17   Horton, Barbette Hair, MD  ondansetron (ZOFRAN) 4 MG tablet Take 1 tablet (4 mg total) by mouth every 6 (six) hours. 05/17/17   Tereasa Coop, PA-C  ondansetron (ZOFRAN) 4 MG tablet Take 1 tablet (4 mg total) by mouth every 8 (eight) hours as needed for nausea or vomiting. 10/25/17   Waldon Merl, PA-C  UNKNOWN TO PATIENT BCPs    [provider]    Allergies    Codeine  Review of Systems   Review of Systems  Constitutional: Negative for chills, diaphoresis and fever.  Respiratory: Negative for cough and shortness of breath.   Cardiovascular: Negative for chest pain.  Gastrointestinal: Positive for abdominal pain. Negative for constipation, diarrhea, nausea and vomiting.  Genitourinary: Positive for vaginal discharge. Negative for difficulty urinating, dysuria, frequency and hematuria.  Musculoskeletal: Positive for back pain.  Neurological: Negative for weakness and numbness.  All other systems reviewed and are negative.   Physical Exam Updated Vital Signs BP (!) 141/97 (BP Location: Right Arm)   Pulse 83   Temp 97.8 F (36.6 C) (Oral)   Resp 18   Ht 5\' 5"  (1.651 m)   Wt 82.6 kg   LMP 05/06/2019   SpO2 98%   BMI 30.29 kg/m   Physical Exam Vitals and nursing note reviewed.  Constitutional:      General: She is not in acute distress.    Appearance: She is well-developed. She is not diaphoretic.  HENT:     Head: Normocephalic and atraumatic.      Mouth/Throat:     Mouth: Mucous membranes are moist.     Pharynx: Oropharynx is clear.  Eyes:     Conjunctiva/sclera: Conjunctivae normal.  Cardiovascular:     Rate and Rhythm: Normal rate and regular rhythm.     Pulses: Normal pulses.          Radial pulses are 2+ on the right side and 2+ on the left side.     Heart sounds: Normal heart sounds.  Pulmonary:     Effort: Pulmonary effort is normal. No respiratory distress.     Breath sounds: Normal breath sounds.  Abdominal:     Palpations: Abdomen is soft.     Tenderness: There is no abdominal tenderness. There is no right CVA tenderness, left CVA tenderness or guarding.     Comments: Absolutely no abdominal tenderness or increased discomfort with even deep palpation.  Genitourinary:    Comments: External genitalia normal Vagina with discharge - thick, white vaginal discharge Cervix  normal negative for cervical motion tenderness Adnexa palpated, no masses, negative for tenderness noted Bladder palpated negative for tenderness Uterus palpated no masses, negative for tenderness  No inguinal lymphadenopathy. Otherwise normal female genitalia. RN, 05/08/2019,  served as chaperone during exam. Musculoskeletal:     Cervical back: Neck supple.     Right lower leg: No edema.     Left lower leg: No edema.     Comments: No tenderness or other abnormalities to the back or flank.  No erythema, rash, or other color change.  Lymphadenopathy:     Cervical: No cervical adenopathy.  Skin:    General: Skin is warm and dry.  Neurological:     Mental Status: She is alert.     Comments: Sensation grossly intact to light touch in the lower extremities bilaterally. No saddle anesthesias. Strength 5/5 in the bilateral lower extremities. No noted gait deficit. Coordination intact.  Psychiatric:        Mood and Affect: Mood and affect normal.        Speech: Speech normal.        Behavior: Behavior normal.     ED Results / Procedures / Treatments     Labs (all labs ordered are listed, but only abnormal results are displayed) Labs Reviewed  WET PREP, GENITAL - Abnormal; Notable for the following components:      Result Value   Clue Cells Wet Prep HPF POC PRESENT (*)    WBC, Wet Prep HPF POC MANY (*)    All other components within normal limits  URINALYSIS, ROUTINE W REFLEX MICROSCOPIC - Abnormal; Notable for the following components:   Specific Gravity, Urine >1.030 (*)    All other components within normal limits  HIV ANTIBODY (ROUTINE TESTING W REFLEX)  BASIC METABOLIC PANEL  CBC WITH DIFFERENTIAL/PLATELET  PREGNANCY, URINE  RPR  GC/CHLAMYDIA PROBE AMP ([place]) NOT AT [facility]    EKG None  Radiology No results found.  Procedures Procedures (including critical care time)  Medications Ordered in ED Medications - No data to display  ED Course  I have reviewed the triage vital signs and the nursing notes.  Pertinent labs & imaging results that were available during my care of the patient were reviewed by me and considered in my medical decision making (see chart for details).    MDM Rules/Calculators/A&P                      Patient presents complaining of abdominal cramping and abnormal vaginal discharge.  She states this issue has been present for the past week, however, she also states she has had BV in the recent past and did not take her antibiotics when she was prescribed them and did not complete the entire course. BV can cause irritation that can cause lower abdominal cramping, especially when it is untreated.  Patient is nontoxic appearing, afebrile, not tachycardic, not tachypneic, not hypotensive, maintains excellent SPO2 on room air, and is in no apparent distress.  She did not have any instances of her abdominal cramping during her ED course.  I reviewed and interpreted the patient's labs. She has no leukocytosis.  My suspicion for surgical abdomen, such as ovarian torsion, appendicitis, etc. is low  based on consideration of the patient's vital signs, presentation, lab findings, and physical exam.   No abdominal tenderness through multiple abdominal exams.  No tenderness or CMT on pelvic exam. She does have WBCs and clue cells on her wet prep today, which I suspect may be due to undertreated BV from noncompliance.  She tells me she prefers intravaginal gel as opposed to oral metronidazole because this medication taken orally gives her chest discomfort.  She was advised to follow-up with OB/GYN on this matter.   Vitals:   05/14/19 0916 05/14/19 0917 05/14/19 1053 05/14/19 1225  BP: (!) 141/97  132/88 (!) 141/88  Pulse: 83  71 100  Resp: 18  18 18   Temp: 97.8 F (36.6 C)     TempSrc: Oral     SpO2: 98%  97% 100%  Weight:  82.6 kg    Height:  5\' 5"  (1.651 m)       Final Clinical Impression(s) / ED Diagnoses Final diagnoses:  BV (bacterial vaginosis)    Rx / DC Orders ED Discharge Orders         Ordered    metroNIDAZOLE (METROGEL) 0.75 % vaginal gel  Daily at bedtime     05/14/19 1220           , PA-C 05/15/19 1020    Anselm Pancoast, MD 05/15/19 1109

## 2019-05-15 LAB — RPR: RPR Ser Ql: NONREACTIVE

## 2019-05-16 LAB — GC/CHLAMYDIA PROBE AMP (~~LOC~~) NOT AT ARMC
Chlamydia: NEGATIVE
Comment: NEGATIVE
Comment: NORMAL
Neisseria Gonorrhea: NEGATIVE

## 2020-05-15 DIAGNOSIS — Z0189 Encounter for other specified special examinations: Secondary | ICD-10-CM | POA: Diagnosis not present

## 2020-05-16 DIAGNOSIS — Z0189 Encounter for other specified special examinations: Secondary | ICD-10-CM | POA: Diagnosis not present

## 2020-07-10 ENCOUNTER — Other Ambulatory Visit: Payer: Self-pay

## 2020-07-10 ENCOUNTER — Encounter (HOSPITAL_BASED_OUTPATIENT_CLINIC_OR_DEPARTMENT_OTHER): Payer: Self-pay

## 2020-07-10 ENCOUNTER — Emergency Department (HOSPITAL_BASED_OUTPATIENT_CLINIC_OR_DEPARTMENT_OTHER): Payer: Self-pay

## 2020-07-10 ENCOUNTER — Emergency Department (HOSPITAL_BASED_OUTPATIENT_CLINIC_OR_DEPARTMENT_OTHER)
Admission: EM | Admit: 2020-07-10 | Discharge: 2020-07-10 | Disposition: A | Discharge status: Home / Self Care | Payer: Self-pay | Attending: Emergency Medicine | Admitting: Emergency Medicine

## 2020-07-10 DIAGNOSIS — F17210 Nicotine dependence, cigarettes, uncomplicated: Secondary | ICD-10-CM | POA: Insufficient documentation

## 2020-07-10 DIAGNOSIS — B9689 Other specified bacterial agents as the cause of diseases classified elsewhere: Secondary | ICD-10-CM

## 2020-07-10 DIAGNOSIS — N760 Acute vaginitis: Secondary | ICD-10-CM | POA: Insufficient documentation

## 2020-07-10 DIAGNOSIS — R52 Pain, unspecified: Secondary | ICD-10-CM

## 2020-07-10 LAB — URINALYSIS, ROUTINE W REFLEX MICROSCOPIC
Bilirubin Urine: NEGATIVE
Glucose, UA: NEGATIVE mg/dL
Hgb urine dipstick: NEGATIVE
Ketones, ur: NEGATIVE mg/dL
Leukocytes,Ua: NEGATIVE
Nitrite: NEGATIVE
Protein, ur: NEGATIVE mg/dL
Specific Gravity, Urine: 1.02 (ref 1.005–1.030)
pH: 6 (ref 5.0–8.0)

## 2020-07-10 LAB — WET PREP, GENITAL
Sperm: NONE SEEN
Trich, Wet Prep: NONE SEEN
Yeast Wet Prep HPF POC: NONE SEEN

## 2020-07-10 LAB — PREGNANCY, URINE: Preg Test, Ur: NEGATIVE

## 2020-07-10 MED ORDER — METRONIDAZOLE 500 MG PO TABS: 500.0000 mg | ORAL_TABLET | Freq: Two times a day (BID) | ORAL | 0 refills | Status: DC

## 2020-07-10 NOTE — ED Triage Notes (Addendum)
Pt c/o RLQ pain intermittently x 1 month. States "its my ovary". Denies hx of ovarian cysts. Also states she "needs antibiotics for a bacterial infection". Believes she has BV, c/o vaginal discharge.

## 2020-07-10 NOTE — ED Provider Notes (Signed)
[facility] EMERGENCY DEPARTMENT Provider Note   CSN: [id number] Arrival date & time: 07/10/20  1035     History Chief Complaint  Patient presents with  . Abdominal Pain  . Vaginal Discharge    Tiffany Brewer is a 25 y.o. female presents with a chief co,plaint of vaginal pain and right sided pelvic pain.  Patient reports that her right sided pelvic pain has been present intermittently over the last month.  Pattient rates her pain 5/10 on the pain scale and describes it as an "ache."  Pain has been constant over the last 2 days.  No aggravating or alleviating factors.    Patient also endorses vaginal discharge.  Patient reports that vaginal discharge is white.  Discharge has been present over the last 2 days.  Patient denies any vaginal pain, vaginal bleeding, vaginal itching, genital sores or lesions.  Patient is sexually active with 1 female partner.  Patient is unsure if this is a mutually monogamous relationship.  LMP 5/14.  G2 P1-0-1-1  Patient denies any fevers, chills, nausea, vomiting, diarrhea  HPI     Past Medical History:  Diagnosis Date  . Bipolar disorder (HCC)   . Gestational diabetes   . OCD (obsessive compulsive disorder)     There are no problems to display for this patient.   Past Surgical History:  Procedure Laterality Date  . INDUCED ABORTION       OB History   No obstetric history on file.     Family History  Problem Relation Age of Onset  . Hypertension Mother     Social History   Tobacco Use  . Smoking status: Current Every Day Smoker    Types: Cigarettes  . Smokeless tobacco: Never Used  Vaping Use  . Vaping Use: Never used  Substance Use Topics  . Alcohol use: Yes    Comment: weekly  . Drug use: Not Currently    Types: Marijuana    Home Medications Prior to Admission medications   Medication Sig Start Date End Date Taking? Authorizing Provider  cetirizine (ZYRTEC) 5 MG tablet Take 1 tablet (5 mg total) by mouth daily.  10/02/17   Gwyneth Sprout, MD  Glycopyrronium Tosylate (QBREXZA) 2.4 % PADS Apply 1 patch topically daily. 10/02/17   Gwyneth Sprout, MD  hydrOXYzine (ATARAX/VISTARIL) 25 MG tablet Take 1 tablet (25 mg total) by mouth every 8 (eight) hours as needed. 08/04/18   Tilden Fossa, MD  NON FORMULARY     [provider]  ondansetron (ZOFRAN ODT) 4 MG disintegrating tablet Take 1 tablet (4 mg total) by mouth every 8 (eight) hours as needed for nausea or vomiting. 04/20/17   Horton, Mayer Masker, MD  ondansetron (ZOFRAN) 4 MG tablet Take 1 tablet (4 mg total) by mouth every 6 (six) hours. 05/17/17   Ofilia Neas, PA-C  ondansetron (ZOFRAN) 4 MG tablet Take 1 tablet (4 mg total) by mouth every 8 (eight) hours as needed for nausea or vomiting. 10/25/17   Demetrio Lapping, PA-C  UNKNOWN TO PATIENT BCPs    [provider]    Allergies    Codeine  Review of Systems   Review of Systems  Constitutional: Negative for chills and fever.  Respiratory: Negative for shortness of breath.   Cardiovascular: Negative for chest pain.  Gastrointestinal: Negative for abdominal distention, abdominal pain, anal bleeding, blood in stool, constipation, diarrhea, nausea and vomiting.  Genitourinary: Positive for pelvic pain and vaginal discharge. Negative for difficulty urinating, dysuria, flank  pain, frequency, genital sores, hematuria, vaginal bleeding and vaginal pain.  Musculoskeletal: Negative for back pain and neck pain.  Skin: Negative for color change and rash.  Allergic/Immunologic: Positive for immunocompromised state.  Neurological: Negative for dizziness, syncope, light-headedness and headaches.  Psychiatric/Behavioral: Negative for confusion.    Physical Exam Updated Vital Signs BP (!) 129/95 (BP Location: Left Arm)   Pulse 74   Temp 98.5 F (36.9 C) (Oral)   Resp 18   Ht 5\' 5"  (1.651 m)   Wt 83.9 kg   LMP 06/23/2020   SpO2 100%   BMI 30.79 kg/m   Physical Exam Vitals and  nursing note reviewed. Exam conducted with a chaperone present ([place] RN present).  Constitutional:      General: She is not in acute distress.    Appearance: She is not ill-appearing, toxic-appearing or diaphoretic.  HENT:     Head: Normocephalic.  Eyes:     General: No scleral icterus.       Right eye: No discharge.        Left eye: No discharge.  Cardiovascular:     Rate and Rhythm: Normal rate.  Pulmonary:     Effort: Pulmonary effort is normal.  Abdominal:     General: Abdomen is flat. Bowel sounds are normal. There is no distension. There are no signs of injury.     Palpations: Abdomen is soft. There is no mass or pulsatile mass.     Tenderness: There is no abdominal tenderness. There is no right CVA tenderness, left CVA tenderness, guarding or rebound.     Hernia: There is no hernia in the umbilical area, ventral area, left inguinal area or right inguinal area.  Genitourinary:    Exam position: Lithotomy position.     Pubic Area: No rash or pubic lice.      Tanner stage (genital): 5.     Labia:        Right: No rash, tenderness, lesion or injury.        Left: No rash, tenderness, lesion or injury.      Urethra: No prolapse.     Vagina: No signs of injury and foreign body. Vaginal discharge (white) present. No erythema, tenderness, bleeding, lesions or prolapsed vaginal walls.     Cervix: No cervical motion tenderness, discharge, friability, lesion, erythema, cervical bleeding or eversion.     Uterus: Not enlarged and not tender.      Adnexa:        Right: Tenderness present. No mass or fullness.         Left: No mass, tenderness or fullness.    Lymphadenopathy:     Lower Body: No right inguinal adenopathy. No left inguinal adenopathy.  Skin:    General: Skin is warm and dry.  Neurological:     General: No focal deficit present.     Mental Status: She is alert.     GCS: GCS eye subscore is 4. GCS verbal subscore is 5. GCS motor subscore is 6.  Psychiatric:         Behavior: Behavior is cooperative.     ED Results / Procedures / Treatments   Labs (all labs ordered are listed, but only abnormal results are displayed) Labs Reviewed  WET PREP, GENITAL - Abnormal; Notable for the following components:      Result Value   Clue Cells Wet Prep HPF POC PRESENT (*)    WBC, Wet Prep HPF POC FEW (*)    All other components  within normal limits  URINALYSIS, ROUTINE W REFLEX MICROSCOPIC  PREGNANCY, URINE  GC/CHLAMYDIA PROBE AMP ([place]) NOT AT [facility]    EKG None  Radiology US PELVIC COMPLETE WITH TRANSVAGINAL  Result Date: 07/10/2020 CLINICAL DATA:  Pelvic pain, primarily on the right EXAM: TRANSABDOMINAL AND TRANSVAGINAL ULTRASOUND OF PELVIS TECHNIQUE: Study was performed transabdominally to optimize pelvic field of view evaluation and transvaginally to optimize internal visceral architecture evaluation. COMPARISON:  October 07, 2015 FINDINGS: Uterus Measurements: 7.3 x 4.2 x 5.1 cm = volume: 81 mL. No fibroids or other mass visualized. Endometrium Thickness: 9 mm.  No focal abnormality visualized. Right ovary Measurements: 3.3 x 2.0 x 2.5 cm = volume: 9 mL. Normal appearance/no adnexal mass. Left ovary Measurements: 3.1 x 1.5 x 1.5 cm = volume: 4 mL. Normal appearance/no adnexal mass. Other findings No abnormal free fluid. IMPRESSION: Study within normal limits. Electronically Signed   By: Bretta Bang III M.D.   On: 07/10/2020 14:24    Procedures Procedures   Medications Ordered in ED Medications - No data to display  ED Course  I have reviewed the triage vital signs and the nursing notes.  Pertinent labs & imaging results that were available during my care of the patient were reviewed by me and considered in my medical decision making (see chart for details).    MDM Rules/Calculators/A&P                          Alert 25 year female in no acute distress, nontoxic appearing.  Patient presents with intermittent right sided pelvic pain  x54month which has been constant over the last 2 days.  Also complains of white vaginal discharge over the last 2 days.    Pregnancy test negative. UA shows no signs of infection.  Wet prep shows clue cells present.  Will treat patient with 7 day course of flagyl.  Patient given education on disulfiram reaction.  G/C testing collected and pending.  Shared decision making with patient who elects to defer treatment at this time.  Patient informed of the need to receive treatment if she is contacted with positive test results.  Patient deferred syphilis and HIV testing at this time.      Patient had tenderness to right adnexa.  Will obtain ultrasound to evaluate for possible mass.  Ultrasound showed no acute finding.  We will have patient follow-up with OB/GYN provider if she continues to have right elbow pain.  Discussed results, findings, treatment and follow up. Patient advised of return precautions. Patient verbalized understanding and agreed with plan.   Final Clinical Impression(s) / ED Diagnoses Final diagnoses:  BV (bacterial vaginosis)    Rx / DC Orders ED Discharge Orders         Ordered    metroNIDAZOLE (FLAGYL) 500 MG tablet  2 times daily        07/10/20 1448           Berneice Heinrich 07/10/20 2228    Tilden Fossa, MD 07/13/20 [phone]

## 2020-07-10 NOTE — ED Notes (Signed)
Patient transported to Ultrasound 

## 2020-07-10 NOTE — Discharge Instructions (Signed)
You came to the emergency department today to be evaluated for your right-sided pelvic pain and vaginal discharge.  Your pregnancy test was negative, you had no signs of a urinary tract infection, ultrasound showed no problems with your uterus or ovaries.  Today your diagnosed with bacterial vaginosis and received a prescription for metronidazole also known as Flagyl. It is very important that you do not consume any alcohol while taking this medication as it will cause you to become violently ill.  You may have diarrhea from the antibiotics.  It is very important that you continue to take the antibiotics even if you get diarrhea unless a medical professional tells you that you may stop taking them.  If you stop too early the bacteria you are being treated for will become stronger and you may need different, more powerful antibiotics that have more side effects and worsening diarrhea.  Please stay well hydrated and consider probiotics as they may decrease the severity of your diarrhea.  Please be aware that if you take any hormonal contraception (birth control pills, nexplanon, the ring, etc) that your birth control will not work while you are taking antibiotics and you need to use back up protection as directed on the birth control medication information insert.   Please follow-up with your primary care provider or OB/GYN if your symptoms do not improve.

## 2020-07-11 LAB — GC/CHLAMYDIA PROBE AMP (~~LOC~~) NOT AT ARMC
Chlamydia: NEGATIVE
Comment: NEGATIVE
Comment: NORMAL
Neisseria Gonorrhea: NEGATIVE

## 2020-10-11 ENCOUNTER — Ambulatory Visit
Admission: EM | Admit: 2020-10-11 | Discharge: 2020-10-11 | Disposition: A | Discharge status: Home / Self Care | Payer: 59 | Attending: Internal Medicine | Admitting: Internal Medicine

## 2020-10-11 ENCOUNTER — Other Ambulatory Visit: Payer: Self-pay

## 2020-10-11 DIAGNOSIS — J069 Acute upper respiratory infection, unspecified: Secondary | ICD-10-CM | POA: Diagnosis not present

## 2020-10-11 DIAGNOSIS — J029 Acute pharyngitis, unspecified: Secondary | ICD-10-CM | POA: Diagnosis not present

## 2020-10-11 DIAGNOSIS — H65192 Other acute nonsuppurative otitis media, left ear: Secondary | ICD-10-CM | POA: Insufficient documentation

## 2020-10-11 LAB — POCT RAPID STREP A (OFFICE): Rapid Strep A Screen: NEGATIVE

## 2020-10-11 MED ORDER — AMOXICILLIN 875 MG PO TABS: 875.0000 mg | ORAL_TABLET | Freq: Two times a day (BID) | ORAL | 0 refills | Status: AC

## 2020-10-11 NOTE — ED Triage Notes (Signed)
Pt c/o   Left ear pain: onset 3 days ago. Says it is now becoming painful in right ear too. Described 7/10 like "I can feel the wind coming through my ear" and achy.   "White patch on throat": onset 3 days ago. Described as painful to swallow.

## 2020-10-11 NOTE — Discharge Instructions (Addendum)
Strep was negative.  Throat culture and COVID-19 PCR pending.  You have been prescribed amoxicillin antibiotic to treat ear infection.

## 2020-10-11 NOTE — ED Provider Notes (Signed)
[facility]    CSN: [id number] Arrival date & time: 10/11/20  1707      History   Chief Complaint Chief Complaint  Patient presents with   Ear Pain    HPI Tiffany Brewer is a 25 y.o. female.   Patient presents with left ear pain, nasal congestion, and sore throat that started approximately 3 days ago.  Denies any known fevers or sick contacts.  Patient states it is very painful to swallow.  Has not yet taken any over-the-counter medications to alleviate symptoms.    Past Medical History:  Diagnosis Date   Bipolar disorder (HCC)    Gestational diabetes    OCD (obsessive compulsive disorder)     There are no problems to display for this patient.   Past Surgical History:  Procedure Laterality Date   INDUCED ABORTION      OB History   No obstetric history on file.      Home Medications    Prior to Admission medications   Medication Sig Start Date End Date Taking? Authorizing Provider  amoxicillin (AMOXIL) 875 MG tablet Take 1 tablet (875 mg total) by mouth 2 (two) times daily for 10 days. 10/11/20 10/21/20 Yes Lance Muss, FNP  cetirizine (ZYRTEC) 5 MG tablet Take 1 tablet (5 mg total) by mouth daily. 10/02/17   Gwyneth Sprout, MD  Glycopyrronium Tosylate (QBREXZA) 2.4 % PADS Apply 1 patch topically daily. 10/02/17   Gwyneth Sprout, MD  hydrOXYzine (ATARAX/VISTARIL) 25 MG tablet Take 1 tablet (25 mg total) by mouth every 8 (eight) hours as needed. 08/04/18   Tilden Fossa, MD  metroNIDAZOLE (FLAGYL) 500 MG tablet Take 1 tablet (500 mg total) by mouth 2 (two) times daily. 07/10/20   Haskel Schroeder, PA-C  NON FORMULARY     [provider]  ondansetron (ZOFRAN ODT) 4 MG disintegrating tablet Take 1 tablet (4 mg total) by mouth every 8 (eight) hours as needed for nausea or vomiting. 04/20/17   Horton, Mayer Masker, MD  ondansetron (ZOFRAN) 4 MG tablet Take 1 tablet (4 mg total) by mouth every 6 (six) hours. 05/17/17   Ofilia Neas, PA-C   ondansetron (ZOFRAN) 4 MG tablet Take 1 tablet (4 mg total) by mouth every 8 (eight) hours as needed for nausea or vomiting. 10/25/17   Demetrio Lapping, PA-C  UNKNOWN TO PATIENT BCPs    [provider]    Family History Family History  Problem Relation Age of Onset   Hypertension Mother     Social History Social History   Tobacco Use   Smoking status: Every Day    Types: Cigarettes   Smokeless tobacco: Never  Vaping Use   Vaping Use: Never used  Substance Use Topics   Alcohol use: Yes    Comment: weekly   Drug use: Not Currently    Types: Marijuana     Allergies   Codeine   Review of Systems Review of Systems Per HPI  Physical Exam Triage Vital Signs ED Triage Vitals  Enc Vitals Group     BP 10/11/20 1752 136/87     Pulse Rate 10/11/20 1752 (!) 58     Resp 10/11/20 1752 20     Temp 10/11/20 1752 99.1 F (37.3 C)     Temp Source 10/11/20 1752 Oral     SpO2 10/11/20 1752 100 %     Weight --      Height --      Head Circumference --  Peak Flow --      Pain Score 10/11/20 1759 4     Pain Loc --      Pain Edu? --      Excl. in GC? --    No data found.  Updated Vital Signs BP 136/87 (BP Location: Left Arm)   Pulse (!) 58   Temp 99.1 F (37.3 C) (Oral)   Resp 20   LMP 08/10/2020 (Approximate)   SpO2 100%   Visual Acuity Right Eye Distance:   Left Eye Distance:   Bilateral Distance:    Right Eye Near:   Left Eye Near:    Bilateral Near:     Physical Exam Constitutional:      General: She is not in acute distress.    Appearance: Normal appearance. She is not ill-appearing, toxic-appearing or diaphoretic.  HENT:     Head: Normocephalic and atraumatic.     Right Ear: Ear canal normal. Tympanic membrane is erythematous. Tympanic membrane is not bulging.     Left Ear: Ear canal normal. A middle ear effusion is present. Tympanic membrane is not erythematous or bulging.     Nose: Congestion present.     Mouth/Throat:     Lips: Pink.      Mouth: Mucous membranes are moist.     Pharynx: Oropharyngeal exudate and posterior oropharyngeal erythema present.  Eyes:     Extraocular Movements: Extraocular movements intact.     Conjunctiva/sclera: Conjunctivae normal.  Cardiovascular:     Rate and Rhythm: Normal rate and regular rhythm.     Pulses: Normal pulses.     Heart sounds: Normal heart sounds.  Pulmonary:     Effort: Pulmonary effort is normal. No respiratory distress.     Breath sounds: Normal breath sounds.  Skin:    General: Skin is warm and dry.  Neurological:     General: No focal deficit present.     Mental Status: She is alert and oriented to person, place, and time. Mental status is at baseline.  Psychiatric:        Mood and Affect: Mood normal.        Behavior: Behavior normal.        Thought Content: Thought content normal.        Judgment: Judgment normal.     UC Treatments / Results  Labs (all labs ordered are listed, but only abnormal results are displayed) Labs Reviewed  CULTURE, GROUP A STREP ([facility])  NOVEL CORONAVIRUS, NAA  POCT RAPID STREP A (OFFICE)    EKG   Radiology No results found.  Procedures Procedures (including critical care time)  Medications Ordered in UC Medications - No data to display  Initial Impression / Assessment and Plan / UC Course  I have reviewed the triage vital signs and the nursing notes.  Pertinent labs & imaging results that were available during my care of the patient were reviewed by me and considered in my medical decision making (see chart for details).     Will treat left ear infection with amoxicillin antibiotic.  Highly suspicious for strep throat as well due to appearance of posterior pharynx on physical exam.  Rapid strep was negative.  Throat culture and COVID-19 PCR pending.  Discussed over-the-counter medications that were alleviate symptoms with patient.Discussed strict return precautions. Patient verbalized understanding and is agreeable  with plan.  Final Clinical Impressions(s) / UC Diagnoses   Final diagnoses:  Sore throat  Other non-recurrent acute nonsuppurative otitis media of left ear  Acute upper respiratory infection     Discharge Instructions      Strep was negative.  Throat culture and COVID-19 PCR pending.  You have been prescribed amoxicillin antibiotic to treat ear infection.     ED Prescriptions     Medication Sig Dispense Auth. Provider   amoxicillin (AMOXIL) 875 MG tablet Take 1 tablet (875 mg total) by mouth 2 (two) times daily for 10 days. 20 tablet Lance Muss, FNP      PDMP not reviewed this encounter.   Lance Muss, FNP 10/11/20 [place]

## 2020-10-12 LAB — SARS-COV-2, NAA 2 DAY TAT

## 2020-10-12 LAB — NOVEL CORONAVIRUS, NAA: SARS-CoV-2, NAA: NOT DETECTED

## 2020-10-14 LAB — CULTURE, GROUP A STREP (THRC)

## 2020-11-12 ENCOUNTER — Encounter: Payer: Self-pay | Admitting: Emergency Medicine

## 2020-11-12 ENCOUNTER — Ambulatory Visit
Admission: EM | Admit: 2020-11-12 | Discharge: 2020-11-12 | Disposition: A | Discharge status: Home / Self Care | Payer: 59 | Attending: Physician Assistant | Admitting: Physician Assistant

## 2020-11-12 ENCOUNTER — Other Ambulatory Visit: Payer: Self-pay

## 2020-11-12 DIAGNOSIS — R058 Other specified cough: Secondary | ICD-10-CM

## 2020-11-12 MED ORDER — BENZONATATE 100 MG PO CAPS: 100.0000 mg | ORAL_CAPSULE | Freq: Three times a day (TID) | ORAL | 0 refills | Status: DC

## 2020-11-12 NOTE — Discharge Instructions (Addendum)
Take tessalon pearls as needed for cough Recommend Delsym as needed for cough Return if you develop worsening symptoms, fevers, chills.

## 2020-11-12 NOTE — ED Triage Notes (Addendum)
Here 1 month prior for ear infection. Cough, now on-going x 3 weeks. States she recently started waking up and noticing some blood streaked mucus. Denies fever, weight loss. States she usually sweats at night from her blood sugar problems. Works at [employer] as well as [employer].

## 2020-11-12 NOTE — ED Provider Notes (Signed)
[facility]    CSN: [id number] Arrival date & time: 11/12/20  1707      History   Chief Complaint Chief Complaint  Patient presents with   Cough    HPI Tiffany Brewer is a 25 y.o. female.   Pt reports about three weeks ago she experienced cough, congestion, and post nasal drip.  Reports the congestion and post nasal drip have improved, but now she is experiencing a dry persistent cough.  She denies fever, chills, night sweats, shortness of breath, chest tightness.     Past Medical History:  Diagnosis Date   Bipolar disorder (HCC)    Gestational diabetes    OCD (obsessive compulsive disorder)     There are no problems to display for this patient.   Past Surgical History:  Procedure Laterality Date   INDUCED ABORTION      OB History   No obstetric history on file.      Home Medications    Prior to Admission medications   Medication Sig Start Date End Date Taking? Authorizing Provider  benzonatate (TESSALON) 100 MG capsule Take 1 capsule (100 mg total) by mouth every 8 (eight) hours. 11/12/20  Yes Ward, Tylene Fantasia, PA-C  cetirizine (ZYRTEC) 5 MG tablet Take 1 tablet (5 mg total) by mouth daily. 10/02/17   Gwyneth Sprout, MD  Glycopyrronium Tosylate (QBREXZA) 2.4 % PADS Apply 1 patch topically daily. 10/02/17   Gwyneth Sprout, MD  hydrOXYzine (ATARAX/VISTARIL) 25 MG tablet Take 1 tablet (25 mg total) by mouth every 8 (eight) hours as needed. 08/04/18   Tilden Fossa, MD  metroNIDAZOLE (FLAGYL) 500 MG tablet Take 1 tablet (500 mg total) by mouth 2 (two) times daily. 07/10/20   Haskel Schroeder, PA-C  NON FORMULARY     [provider]  ondansetron (ZOFRAN ODT) 4 MG disintegrating tablet Take 1 tablet (4 mg total) by mouth every 8 (eight) hours as needed for nausea or vomiting. 04/20/17   Horton, Mayer Masker, MD  ondansetron (ZOFRAN) 4 MG tablet Take 1 tablet (4 mg total) by mouth every 6 (six) hours. 05/17/17   Ofilia Neas, PA-C  ondansetron  (ZOFRAN) 4 MG tablet Take 1 tablet (4 mg total) by mouth every 8 (eight) hours as needed for nausea or vomiting. 10/25/17   Demetrio Lapping, PA-C  UNKNOWN TO PATIENT BCPs    [provider]    Family History Family History  Problem Relation Age of Onset   Hypertension Mother     Social History Social History   Tobacco Use   Smoking status: Every Day    Types: Cigarettes   Smokeless tobacco: Never  Vaping Use   Vaping Use: Never used  Substance Use Topics   Alcohol use: Yes    Comment: weekly   Drug use: Not Currently    Types: Marijuana     Allergies   Codeine   Review of Systems Review of Systems  Constitutional:  Negative for chills and fever.  HENT:  Negative for ear pain and sore throat.   Eyes:  Negative for pain and visual disturbance.  Respiratory:  Positive for cough. Negative for shortness of breath.   Cardiovascular:  Negative for chest pain and palpitations.  Gastrointestinal:  Negative for abdominal pain and vomiting.  Genitourinary:  Negative for dysuria and hematuria.  Musculoskeletal:  Negative for arthralgias and back pain.  Skin:  Negative for color change and rash.  Neurological:  Negative for seizures and syncope.  All other  systems reviewed and are negative.   Physical Exam Triage Vital Signs ED Triage Vitals [11/12/20 1752]  Enc Vitals Group     BP (!) 151/94     Pulse Rate 90     Resp 16     Temp 98.1 F (36.7 C)     Temp Source Oral     SpO2 98 %     Weight      Height      Head Circumference      Peak Flow      Pain Score 0     Pain Loc      Pain Edu?      Excl. in GC?    No data found.  Updated Vital Signs BP (!) 151/94 (BP Location: Left Arm)   Pulse 90   Temp 98.1 F (36.7 C) (Oral)   Resp 16   SpO2 98%   Visual Acuity Right Eye Distance:   Left Eye Distance:   Bilateral Distance:    Right Eye Near:   Left Eye Near:    Bilateral Near:     Physical Exam Vitals and nursing note reviewed.   Constitutional:      General: She is not in acute distress.    Appearance: She is well-developed.  HENT:     Head: Normocephalic and atraumatic.  Eyes:     Conjunctiva/sclera: Conjunctivae normal.  Cardiovascular:     Rate and Rhythm: Normal rate and regular rhythm.     Heart sounds: No murmur heard. Pulmonary:     Effort: Pulmonary effort is normal. No respiratory distress.     Breath sounds: Normal breath sounds.  Abdominal:     Palpations: Abdomen is soft.     Tenderness: There is no abdominal tenderness.  Musculoskeletal:     Cervical back: Neck supple.  Skin:    General: Skin is warm and dry.  Neurological:     Mental Status: She is alert.     UC Treatments / Results  Labs (all labs ordered are listed, but only abnormal results are displayed) Labs Reviewed - No data to display  EKG   Radiology No results found.  Procedures Procedures (including critical care time)  Medications Ordered in UC Medications - No data to display  Initial Impression / Assessment and Plan / UC Course  I have reviewed the triage vital signs and the nursing notes.  Pertinent labs & imaging results that were available during my care of the patient were reviewed by me and considered in my medical decision making (see chart for details).     Cough, vitals wnl, lungs clear to auscultation, pt well appearing. Tessalon pearls prescribed.  Advised use of Delsym.  Return precautions discussed.   Final Clinical Impressions(s) / UC Diagnoses   Final diagnoses:  Post-viral cough syndrome     Discharge Instructions      Take tessalon pearls as needed for cough Recommend Delsym as needed for cough Return if you develop worsening symptoms, fevers, chills.      ED Prescriptions     Medication Sig Dispense Auth. Provider   benzonatate (TESSALON) 100 MG capsule Take 1 capsule (100 mg total) by mouth every 8 (eight) hours. 21 capsule Ward, Tylene Fantasia, PA-C      PDMP not reviewed  this encounter.   Ward, Tylene Fantasia, PA-C 11/12/20 1820

## 2021-01-04 ENCOUNTER — Other Ambulatory Visit: Payer: Self-pay

## 2021-01-04 ENCOUNTER — Emergency Department (HOSPITAL_BASED_OUTPATIENT_CLINIC_OR_DEPARTMENT_OTHER)
Admission: EM | Admit: 2021-01-04 | Discharge: 2021-01-04 | Disposition: A | Discharge status: Home / Self Care | Payer: 59 | Attending: Emergency Medicine | Admitting: Emergency Medicine

## 2021-01-04 ENCOUNTER — Encounter (HOSPITAL_BASED_OUTPATIENT_CLINIC_OR_DEPARTMENT_OTHER): Payer: Self-pay | Admitting: *Deleted

## 2021-01-04 DIAGNOSIS — N898 Other specified noninflammatory disorders of vagina: Secondary | ICD-10-CM | POA: Diagnosis present

## 2021-01-04 DIAGNOSIS — B9689 Other specified bacterial agents as the cause of diseases classified elsewhere: Secondary | ICD-10-CM | POA: Diagnosis not present

## 2021-01-04 DIAGNOSIS — F17210 Nicotine dependence, cigarettes, uncomplicated: Secondary | ICD-10-CM | POA: Insufficient documentation

## 2021-01-04 DIAGNOSIS — N760 Acute vaginitis: Secondary | ICD-10-CM | POA: Insufficient documentation

## 2021-01-04 LAB — WET PREP, GENITAL
Sperm: NONE SEEN
Trich, Wet Prep: NONE SEEN
WBC, Wet Prep HPF POC: <10 (ref <10)
Yeast Wet Prep HPF POC: NONE SEEN

## 2021-01-04 LAB — PREGNANCY, URINE: Preg Test, Ur: NEGATIVE

## 2021-01-04 LAB — URINALYSIS, ROUTINE W REFLEX MICROSCOPIC
Bilirubin Urine: NEGATIVE
Glucose, UA: NEGATIVE mg/dL
Hgb urine dipstick: NEGATIVE
Ketones, ur: 5 mg/dL — AB
Leukocytes,Ua: NEGATIVE
Nitrite: NEGATIVE
Protein, ur: NEGATIVE mg/dL
Specific Gravity, Urine: 1.025 (ref 1.005–1.030)
pH: 7.5 (ref 5.0–8.0)

## 2021-01-04 MED ORDER — METRONIDAZOLE 500 MG PO TABS: 500.0000 mg | ORAL_TABLET | Freq: Two times a day (BID) | ORAL | 0 refills | Status: AC

## 2021-01-04 NOTE — Discharge Instructions (Signed)
Wet prep shows that you have BV, starting you on antibiotics  take as prescribed, do not consume alcohol while taking this medication.  Your gonorrhea and Chlamydia tests are pending at this time please remain abstinent until get results back if positive hospital will call you to prescribe antibiotics.  Recommend safe sex practices.  Please follow with health form for further and future STD issues.  Come back to the emergency department if you develop chest pain, shortness of breath, severe abdominal pain, uncontrolled nausea, vomiting, diarrhea.

## 2021-01-04 NOTE — ED Provider Notes (Signed)
[facility]ENC[facility] DEPARTMENT Provider Note   CSN: [id number] Arrival date & time: 01/04/21  1941     History Chief Complaint  Patient presents with   Vaginal Discharge    Tiffany Brewer is a 25 y.o. female.  HPI  Patient with significant medical history including bipolar, OCD presents to the ED with complaints of vaginal discharge.  Patient states this started about 4 days ago, states that she has a clear slight discharge, without odor, no vaginal bleeding, intermittent cramping but no actual vaginal tenderness, 1 episode of nausea without vomiting, no stomach pains, flank pain, urinary symptoms, dysuria, hematuria, flank pain.  No fevers, chills, chest pain, shortness of breath, general body aches.  States that she is sexually active, last sexual intercourse was 2 weeks ago, states that she would like to be checked out for an STD.  She denies alleviating or aggravating factors.  Has no history of ovarian torsion's, ovarian cyst, ectopic pregnancies, last menstrual cycle was 1 month ago, has no other complaints.  Past Medical History:  Diagnosis Date   Bipolar disorder ([place])    Gestational diabetes    OCD (obsessive compulsive disorder)     There are no problems to display for this patient.   Past Surgical History:  Procedure Laterality Date   INDUCED ABORTION       OB History   No obstetric history on file.     Family History  Problem Relation Age of Onset   Hypertension Mother     Social History   Tobacco Use   Smoking status: Every Day    Types: Cigarettes   Smokeless tobacco: Never  Vaping Use   Vaping Use: Never used  Substance Use Topics   Alcohol use: Yes    Comment: weekly   Drug use: Not Currently    Types: Marijuana    Home Medications Prior to Admission medications   Medication Sig Start Date End Date Taking? Authorizing Provider  metroNIDAZOLE (FLAGYL) 500 MG tablet Take 1 tablet (500 mg total) by mouth 2 (two) times daily for 7  days. 01/04/21 01/11/21 Yes Marcello Fennel, PA-C  benzonatate (TESSALON) 100 MG capsule Take 1 capsule (100 mg total) by mouth every 8 (eight) hours. 11/12/20   Ward, Lenise Arena, PA-C  cetirizine (ZYRTEC) 5 MG tablet Take 1 tablet (5 mg total) by mouth daily. 10/02/17   Blanchie Dessert, MD  Glycopyrronium Tosylate (QBREXZA) 2.4 % PADS Apply 1 patch topically daily. 10/02/17   Blanchie Dessert, MD  hydrOXYzine (ATARAX/VISTARIL) 25 MG tablet Take 1 tablet (25 mg total) by mouth every 8 (eight) hours as needed. 08/04/18   Quintella Reichert, MD  NON FORMULARY     [provider]  ondansetron (ZOFRAN ODT) 4 MG disintegrating tablet Take 1 tablet (4 mg total) by mouth every 8 (eight) hours as needed for nausea or vomiting. 04/20/17   Horton, Barbette Hair, MD  ondansetron (ZOFRAN) 4 MG tablet Take 1 tablet (4 mg total) by mouth every 6 (six) hours. 05/17/17   Tereasa Coop, PA-C  ondansetron (ZOFRAN) 4 MG tablet Take 1 tablet (4 mg total) by mouth every 8 (eight) hours as needed for nausea or vomiting. 10/25/17   Waldon Merl, PA-C  UNKNOWN TO PATIENT BCPs    [provider]    Allergies    Codeine  Review of Systems   Review of Systems  Constitutional:  Negative for chills and fever.  HENT:  Negative for congestion.   Respiratory:  Negative for shortness of breath.   Cardiovascular:  Negative for chest pain.  Gastrointestinal:  Negative for abdominal pain.  Genitourinary:  Positive for vaginal discharge. Negative for dyspareunia, enuresis, flank pain, hematuria, menstrual problem, pelvic pain, vaginal bleeding and vaginal pain.  Musculoskeletal:  Negative for back pain.  Skin:  Negative for rash.  Neurological:  Negative for dizziness.  Hematological:  Does not bruise/bleed easily.   Physical Exam Updated Vital Signs BP (!) 146/106   Pulse (!) 109   Temp 98.2 F (36.8 C)   Resp 18   Ht 5\' 5"  (1.651 m)   Wt 83.9 kg   LMP 11/16/2020   SpO2 97%   BMI 30.78 kg/m    Physical Exam Vitals and nursing note reviewed.  Constitutional:      General: She is not in acute distress.    Appearance: She is not ill-appearing.  HENT:     Head: Normocephalic and atraumatic.     Nose: No congestion.  Eyes:     Conjunctiva/sclera: Conjunctivae normal.  Cardiovascular:     Rate and Rhythm: Normal rate and regular rhythm.     Pulses: Normal pulses.     Heart sounds: No murmur heard.   No friction rub. No gallop.  Pulmonary:     Effort: No respiratory distress.     Breath sounds: No wheezing, rhonchi or rales.  Abdominal:     Palpations: Abdomen is soft.     Tenderness: There is no abdominal tenderness. There is no right CVA tenderness or left CVA tenderness.  Musculoskeletal:     Right lower leg: No edema.     Left lower leg: No edema.  Skin:    General: Skin is warm and dry.  Neurological:     Mental Status: She is alert.  Psychiatric:        Mood and Affect: Mood normal.    ED Results / Procedures / Treatments   Labs (all labs ordered are listed, but only abnormal results are displayed) Labs Reviewed  WET PREP, GENITAL - Abnormal; Notable for the following components:      Result Value   Clue Cells Wet Prep HPF POC PRESENT (*)    All other components within normal limits  URINALYSIS, ROUTINE W REFLEX MICROSCOPIC - Abnormal; Notable for the following components:   APPearance HAZY (*)    Ketones, ur 5 (*)    All other components within normal limits  PREGNANCY, URINE  GC/CHLAMYDIA PROBE AMP ([place]) NOT AT [facility]    EKG None  Radiology No results found.  Procedures Procedures   Medications Ordered in ED Medications - No data to display  ED Course  I have reviewed the triage vital signs and the nursing notes.  Pertinent labs & imaging results that were available during my care of the patient were reviewed by me and considered in my medical decision making (see chart for details).    MDM Rules/Calculators/A&P                           Initial impression-presents with vaginal discharge and intermittent cramping.  Alert, no acute distress, vital signs reassuring.  Triage obtain UA urine pregnancy, will add on wet prep as well as GC chlamydia and reassess.  Work-up-UA unremarkable, urine pregnancy negative, wet prep positive for clue cells.  GC committee pending.  Rule out-low suspicion for UTI, [place], kidney stone she denies any urinary symptoms, no flank tenderness, UA negative  for signs infection or hematuria. Low Suspicion for ectopic pregnancy as urine pregnancy is negative.  Low suspicion for ovarian torsion as presentation atypical etiology, no significant pain, only endorses intermittent cramping, nontender on my exam, patient is nontoxic-appearing, vital signs reassuring.  Low suspicion for STI as she states she has clearish discharge, no vaginal pain, no fevers or chills.  Will defer on antibiotic treatment at this time and await GC chlamydia cultures.  Low suspicion for appendicitis she has no right lower quadrant tenderness, vital signs reassuring, presentation atypical etiology.    Plan-  BV-will treat patient with Flagyl, instructed not to drink alcohol take this medication, follow-up with PCP for reevaluation.  Vital signs have remained stable, no indication for hospital admission. Patient given at home care as well strict return precautions.  Patient verbalized that they understood agreed to said plan.  Final Clinical Impression(s) / ED Diagnoses Final diagnoses:  BV (bacterial vaginosis)    Rx / DC Orders ED Discharge Orders          Ordered    metroNIDAZOLE (FLAGYL) 500 MG tablet  2 times daily        01/04/21 2246             Marcello Fennel, PA-C 01/04/21 2248    Malvin Johns, MD 01/05/21 1517

## 2021-01-04 NOTE — ED Notes (Signed)
Pt d/c home per MD order. Discharge summary reviewed with pt, pt verbalizes understanding. Ambulatory off unit. No s/s of acute distress noted at discharge.  °

## 2021-01-04 NOTE — ED Triage Notes (Signed)
Vaginal discharge x 4 days. Nausea.

## 2021-01-07 LAB — GC/CHLAMYDIA PROBE AMP (~~LOC~~) NOT AT ARMC
Chlamydia: NEGATIVE
Comment: NEGATIVE
Comment: NORMAL
Neisseria Gonorrhea: NEGATIVE

## 2021-03-02 ENCOUNTER — Other Ambulatory Visit: Payer: Self-pay

## 2021-03-02 ENCOUNTER — Ambulatory Visit
Admission: EM | Admit: 2021-03-02 | Discharge: 2021-03-02 | Disposition: A | Discharge status: Home / Self Care | Payer: 59 | Attending: Physician Assistant | Admitting: Physician Assistant

## 2021-03-02 DIAGNOSIS — U071 COVID-19: Secondary | ICD-10-CM

## 2021-03-02 NOTE — ED Provider Notes (Signed)
[facility]    CSN: [id number] Arrival date & time: 03/02/21  1429      History   Chief Complaint Chief Complaint  Patient presents with   Nasal Congestion    HPI Tiffany Brewer is a 26 y.o. female.   Patient here today for evaluation of headache, congestion, chills, fatigue, body aches and cough that started 4 days ago. She has had some shortness of breath. She is unsure if she has had fever, she has not checked temperature but has had chills. She has not had diarrhea since her first day of symptoms. She has taken OTC meds with some relief.   The history is provided by the patient.   Past Medical History:  Diagnosis Date   Bipolar disorder ([facility])    Gestational diabetes    OCD (obsessive compulsive disorder)     There are no problems to display for this patient.   Past Surgical History:  Procedure Laterality Date   INDUCED ABORTION      OB History   No obstetric history on file.      Home Medications    Prior to Admission medications   Medication Sig Start Date End Date Taking? Authorizing Provider  benzonatate (TESSALON) 100 MG capsule Take 1 capsule (100 mg total) by mouth every 8 (eight) hours. 11/12/20   Ward, Tylene Fantasia, PA-C  cetirizine (ZYRTEC) 5 MG tablet Take 1 tablet (5 mg total) by mouth daily. 10/02/17   Gwyneth Sprout, MD  Glycopyrronium Tosylate (QBREXZA) 2.4 % PADS Apply 1 patch topically daily. 10/02/17   Gwyneth Sprout, MD  hydrOXYzine (ATARAX/VISTARIL) 25 MG tablet Take 1 tablet (25 mg total) by mouth every 8 (eight) hours as needed. 08/04/18   Tilden Fossa, MD  NON FORMULARY     [provider]  ondansetron (ZOFRAN ODT) 4 MG disintegrating tablet Take 1 tablet (4 mg total) by mouth every 8 (eight) hours as needed for nausea or vomiting. 04/20/17   Horton, Mayer Masker, MD  ondansetron (ZOFRAN) 4 MG tablet Take 1 tablet (4 mg total) by mouth every 6 (six) hours. 05/17/17   Ofilia Neas, PA-C  ondansetron (ZOFRAN) 4 MG  tablet Take 1 tablet (4 mg total) by mouth every 8 (eight) hours as needed for nausea or vomiting. 10/25/17   Demetrio Lapping, PA-C  UNKNOWN TO PATIENT BCPs    [provider]    Family History Family History  Problem Relation Age of Onset   Hypertension Mother     Social History Social History   Tobacco Use   Smoking status: Every Day    Types: Cigarettes   Smokeless tobacco: Never  Vaping Use   Vaping Use: Never used  Substance Use Topics   Alcohol use: Yes    Comment: weekly   Drug use: Not Currently    Types: Marijuana     Allergies   Codeine   Review of Systems Review of Systems  Constitutional:  Positive for chills and fever (subjective).  HENT:  Positive for congestion. Negative for ear pain and sore throat.   Eyes:  Negative for discharge and redness.  Respiratory:  Positive for cough and shortness of breath. Negative for wheezing.   Gastrointestinal:  Positive for diarrhea and nausea. Negative for abdominal pain and vomiting.  Musculoskeletal:  Positive for myalgias.  Neurological:  Positive for headaches.    Physical Exam Triage Vital Signs ED Triage Vitals  Enc Vitals Group     BP 03/02/21 1520 (!) 154/104  Pulse Rate 03/02/21 1520 98     Resp 03/02/21 1520 18     Temp 03/02/21 1520 98.9 F (37.2 C)     Temp Source 03/02/21 1520 Oral     SpO2 03/02/21 1520 98 %     Weight --      Height --      Head Circumference --      Peak Flow --      Pain Score 03/02/21 1523 5     Pain Loc --      Pain Edu? --      Excl. in GC? --    No data found.  Updated Vital Signs BP (!) 154/104 (BP Location: Left Arm)    Pulse 98    Temp 98.9 F (37.2 C) (Oral)    Resp 18    SpO2 98%      Physical Exam Vitals and nursing note reviewed.  Constitutional:      General: She is not in acute distress.    Appearance: Normal appearance. She is not ill-appearing.  HENT:     Head: Normocephalic and atraumatic.     Nose: Congestion present.  Eyes:      Conjunctiva/sclera: Conjunctivae normal.  Cardiovascular:     Rate and Rhythm: Normal rate and regular rhythm.     Heart sounds: Normal heart sounds. No murmur heard. Pulmonary:     Effort: Pulmonary effort is normal. No respiratory distress.     Breath sounds: Normal breath sounds. No wheezing, rhonchi or rales.  Skin:    General: Skin is warm and dry.  Neurological:     Mental Status: She is alert.  Psychiatric:        Mood and Affect: Mood normal.        Thought Content: Thought content normal.     UC Treatments / Results  Labs (all labs ordered are listed, but only abnormal results are displayed) Labs Reviewed  COVID-19, FLU A+B NAA    EKG   Radiology No results found.  Procedures Procedures (including critical care time)  Medications Ordered in UC Medications - No data to display  Initial Impression / Assessment and Plan / UC Course  I have reviewed the triage vital signs and the nursing notes.  Pertinent labs & imaging results that were available during my care of the patient were reviewed by me and considered in my medical decision making (see chart for details).    Covid and flu PCR ordered. Given positive at home tests will suspect covid and recommended continued symptomatic treatment. Encouraged follow up with any further concerns.   Final Clinical Impressions(s) / UC Diagnoses   Final diagnoses:  COVID-19   Discharge Instructions   None    ED Prescriptions   None    PDMP not reviewed this encounter.   Tomi Bamberger, PA-C 03/02/21 1534

## 2021-03-02 NOTE — ED Triage Notes (Signed)
4 day h/o HA, congestion, chills, fatigue, body aches, cough and sob.   Confirms some nausea and diarrhea. No emesis. Has been taking dayquil, nyquil and vitamin C with some relief. Positive at home covid test. Pt is requesting a work note.

## 2021-03-03 LAB — COVID-19, FLU A+B NAA
Influenza A, NAA: NOT DETECTED
Influenza B, NAA: NOT DETECTED
SARS-CoV-2, NAA: DETECTED — AB

## 2021-04-17 NOTE — Progress Notes (Deleted)
? ?  New Patient Office Visit ? ?Subjective:  ?Patient ID: Tiffany Brewer, female    DOB: 1995-03-12  Age: 26 y.o. MRN: [id number] ? ?CC: No chief complaint on file. ? ? ?HPI ?Tiffany Brewer presents for new patient visit to establish care.  Introduced to Publishing rights manager role and practice setting.  All questions answered.  Discussed provider/patient relationship and expectations. ? ? ?Past Medical History:  ?Diagnosis Date  ? Bipolar disorder (H[facility])   ? Gestational diabetes   ? OCD (obsessive compulsive disorder)   ? ? ?Past Surgical History:  ?Procedure Laterality Date  ? INDUCED ABORTION    ? ? ?Family History  ?Problem Relation Age of Onset  ? Hypertension Mother   ? ? ?Social History  ? ?Socioeconomic History  ? Marital status: Single  ?  Spouse name: Not on file  ? Number of children: Not on file  ? Years of education: Not on file  ? Highest education level: Not on file  ?Occupational History  ? Not on file  ?Tobacco Use  ? Smoking status: Every Day  ?  Types: Cigarettes  ? Smokeless tobacco: Never  ?Vaping Use  ? Vaping Use: Never used  ?Substance and Sexual Activity  ? Alcohol use: Yes  ?  Comment: weekly  ? Drug use: Not Currently  ?  Types: Marijuana  ? Sexual activity: Yes  ?  Birth control/protection: Pill  ?Other Topics Concern  ? Not on file  ?Social History Narrative  ? Not on file  ? ?Social Determinants of Health  ? ?Financial Resource Strain: Not on file  ?Food Insecurity: Not on file  ?Transportation Needs: Not on file  ?Physical Activity: Not on file  ?Stress: Not on file  ?Social Connections: Not on file  ?Intimate Partner Violence: Not on file  ? ? ?ROS ?Review of Systems ? ?Objective:  ? ?Today's Vitals: There were no vitals taken for this visit. ? ?Physical Exam ? ?Assessment & Plan:  ? ?Problem List Items Addressed This Visit   ?None ? ? ?Outpatient Encounter Medications as of 04/19/2021  ?Medication Sig  ? benzonatate (TESSALON) 100 MG capsule Take 1 capsule (100 mg total) by mouth every 8 (eight)  hours.  ? cetirizine (ZYRTEC) 5 MG tablet Take 1 tablet (5 mg total) by mouth daily.  ? Glycopyrronium Tosylate (QBREXZA) 2.4 % PADS Apply 1 patch topically daily.  ? hydrOXYzine (ATARAX/VISTARIL) 25 MG tablet Take 1 tablet (25 mg total) by mouth every 8 (eight) hours as needed.  ? NON FORMULARY   ? ondansetron (ZOFRAN ODT) 4 MG disintegrating tablet Take 1 tablet (4 mg total) by mouth every 8 (eight) hours as needed for nausea or vomiting.  ? ondansetron (ZOFRAN) 4 MG tablet Take 1 tablet (4 mg total) by mouth every 6 (six) hours.  ? ondansetron (ZOFRAN) 4 MG tablet Take 1 tablet (4 mg total) by mouth every 8 (eight) hours as needed for nausea or vomiting.  ? UNKNOWN TO PATIENT BCPs  ? ?No facility-administered encounter medications on file as of 04/19/2021.  ? ? ?Follow-up: No follow-ups on file.  ? ?Gerre Scull, NP ? ?

## 2021-04-19 ENCOUNTER — Ambulatory Visit: Payer: Medicaid Other | Admitting: Nurse Practitioner

## 2021-05-02 ENCOUNTER — Encounter: Payer: Self-pay | Admitting: Nurse Practitioner

## 2021-05-02 ENCOUNTER — Ambulatory Visit (INDEPENDENT_AMBULATORY_CARE_PROVIDER_SITE_OTHER): Payer: Commercial Managed Care - PPO | Admitting: Nurse Practitioner

## 2021-05-02 VITALS — BP 130/86 | HR 75 | Temp 96.8°F | Ht 65.0 in | Wt 173.6 lb

## 2021-05-02 DIAGNOSIS — R61 Generalized hyperhidrosis: Secondary | ICD-10-CM | POA: Insufficient documentation

## 2021-05-02 DIAGNOSIS — Z8632 Personal history of gestational diabetes: Secondary | ICD-10-CM | POA: Diagnosis not present

## 2021-05-02 DIAGNOSIS — R109 Unspecified abdominal pain: Secondary | ICD-10-CM | POA: Insufficient documentation

## 2021-05-02 DIAGNOSIS — Z720 Tobacco use: Secondary | ICD-10-CM

## 2021-05-02 DIAGNOSIS — R030 Elevated blood-pressure reading, without diagnosis of hypertension: Secondary | ICD-10-CM

## 2021-05-02 LAB — CBC WITH DIFFERENTIAL/PLATELET
Basophils Absolute: 0 10*3/uL (ref 0.0–0.1)
Basophils Relative: 0.5 % (ref 0.0–3.0)
Eosinophils Absolute: 0.1 10*3/uL (ref 0.0–0.7)
Eosinophils Relative: 2.4 % (ref 0.0–5.0)
HCT: 41.2 % (ref 36.0–46.0)
Hemoglobin: 13.7 g/dL (ref 12.0–15.0)
Lymphocytes Relative: 40 % (ref 12.0–46.0)
Lymphs Abs: 1.6 10*3/uL (ref 0.7–4.0)
MCHC: 33.3 g/dL (ref 30.0–36.0)
MCV: 89.6 fl (ref 78.0–100.0)
Monocytes Absolute: 0.3 10*3/uL (ref 0.1–1.0)
Monocytes Relative: 8.2 % (ref 3.0–12.0)
Neutro Abs: 2 10*3/uL (ref 1.4–7.7)
Neutrophils Relative %: 48.9 % (ref 43.0–77.0)
Platelets: 251 10*3/uL (ref 150.0–400.0)
RBC: 4.6 Mil/uL (ref 3.87–5.11)
RDW: 14.4 % (ref 11.5–15.5)
WBC: 4.1 10*3/uL (ref 4.0–10.5)

## 2021-05-02 LAB — COMPREHENSIVE METABOLIC PANEL
ALT: 11 U/L (ref 0–35)
AST: 14 U/L (ref 0–37)
Albumin: 4.4 g/dL (ref 3.5–5.2)
Alkaline Phosphatase: 35 U/L — ABNORMAL LOW (ref 39–117)
BUN: 7 mg/dL (ref 6–23)
CO2: 29 mEq/L (ref 19–32)
Calcium: 9.5 mg/dL (ref 8.4–10.5)
Chloride: 103 mEq/L (ref 96–112)
Creatinine, Ser: 0.7 mg/dL (ref 0.40–1.20)
GFR: 118.02 mL/min (ref >60.00)
Glucose, Bld: 88 mg/dL (ref 70–99)
Potassium: 4.6 mEq/L (ref 3.5–5.1)
Sodium: 137 mEq/L (ref 135–145)
Total Bilirubin: 0.6 mg/dL (ref 0.2–1.2)
Total Protein: 6.8 g/dL (ref 6.0–8.3)

## 2021-05-02 LAB — HEMOGLOBIN A1C: Hgb A1c MFr Bld: 6.2 % (ref 4.6–6.5)

## 2021-05-02 LAB — TSH: TSH: 2.15 u[IU]/mL (ref 0.35–5.50)

## 2021-05-02 LAB — POCT URINE PREGNANCY: Preg Test, Ur: NEGATIVE

## 2021-05-02 NOTE — Assessment & Plan Note (Signed)
She currently smokes about 7 cigarettes/day.  Encouraged to complete tobacco cessation. 

## 2021-05-02 NOTE — Patient Instructions (Signed)
It was great to see you! ? ?We are checking your labs including your blood sugars. I recommend you start checking your blood pressure at home and writing it down.  ? ?I recommend you start a prenatal vitamin daily, stop smoking and drinking until we get the test results back. You can schedule a lab visit for your pregnancy test.  ? ?Let's follow-up in 4 weeks, sooner if you have concerns. ? ?If a referral was placed today, you will be contacted for an appointment. Please note that routine referrals can sometimes take up to 3-4 weeks to process. Please call our office if you haven't heard anything after this time frame. ? ?Take care, ? ?Rodman Pickle, NP ? ?

## 2021-05-02 NOTE — Assessment & Plan Note (Signed)
She has a history of gestational diabetes with her pregnancy 8 years ago.  She has noticed some increased urinary frequency in the past few days.  We will check A1c today. ?

## 2021-05-02 NOTE — Assessment & Plan Note (Addendum)
History of night sweats, she states that she had a work-up done by her GYN several years ago that was negative.  We will check TSH today.  Denies weight loss, fevers, cough. ?

## 2021-05-02 NOTE — Assessment & Plan Note (Signed)
Pressure elevated at 130/86.  Discussed monitoring her blood pressure at home, and limiting salt in her diet.  Also discussed smoking cessation as this can lead to elevated blood pressure.  Follow-up in 4 weeks. ?

## 2021-05-02 NOTE — Assessment & Plan Note (Addendum)
Endorses lower pelvic abdominal cramping for the past few days.  She is concerned about possible pregnancy, she has not been taking her birth control pill as prescribed.  Urine point-of-care pregnancy negative in office today, however discussed that it could be too early for testing to be positive.  Her last menstrual period was at the beginning of March.  She is interested in a blood test for pregnancy verification, however she would like to wait a few days to make sure it is accurate.  She can come back for a lab visit to have this drawn.  She denies concern or need for STD testing.  Check CMP, CBC.  Encouraged her to stop taking birth control, stop smoking, drinking alcohol until we get the results of her pregnancy test.  Follow-up with blood hCG test. ?

## 2021-05-02 NOTE — Progress Notes (Signed)
? ?New Patient Office Visit ? ?Subjective:  ?Patient ID: Tiffany Brewer, female    DOB: 05/07/1995  Age: 26 y.o. MRN: [id number] ? ?CC:  ?Chief Complaint  ?Patient presents with  ? Establish Care  ?  Np. Est care. Pt c/o abd pain, states she is possibly pregnant. Pt requesting A1c check  ? ? ?HPI ?Tiffany Brewer presents for new patient visit to establish care.  Introduced to Publishing rights manager role and practice setting.  All questions answered.  Discussed provider/patient relationship and expectations. ? ?She has a history of night sweats for the past several years. Denies fevers, coughing.  She had this worked up with her GYN and all of her lab results were unrevealing.   ? ?She has endorses some pelvic cramping for the past couple days and is concerned about a possible pregnancy. She is on birth control pill, however is not taking it as she should. Recently in the last few days, she has noticed urinary frequency, nausea, breast tenderness.  She had intercourse last few days ago.  She thinks she may be pregnant.  She does have a history of gestational diabetes during her first pregnancy 8 years ago.  She would like her sugar checked today. ? ?Past Medical History:  ?Diagnosis Date  ? Bipolar disorder (HCC)   ? Gestational diabetes   ? OCD (obsessive compulsive disorder)   ? ? ?Past Surgical History:  ?Procedure Laterality Date  ? INDUCED ABORTION    ? ? ?Family History  ?Problem Relation Age of Onset  ? Hypertension Mother   ? Heart disease Paternal Grandmother   ? ? ?Social History  ? ?Socioeconomic History  ? Marital status: Single  ?  Spouse name: Not on file  ? Number of children: Not on file  ? Years of education: Not on file  ? Highest education level: Not on file  ?Occupational History  ? Not on file  ?Tobacco Use  ? Smoking status: Every Day  ?  Packs/day: 0.50  ?  Years: 10.00  ?  Pack years: 5.00  ?  Types: Cigarettes  ? Smokeless tobacco: Never  ?Vaping Use  ? Vaping Use: Never used  ?Substance and Sexual  Activity  ? Alcohol use: Yes  ?  Comment: occasionally  ? Drug use: Not Currently  ?  Types: Marijuana  ? Sexual activity: Yes  ?  Birth control/protection: Pill  ?Other Topics Concern  ? Not on file  ?Social History Narrative  ? Not on file  ? ?Social Determinants of Health  ? ?Financial Resource Strain: Not on file  ?Food Insecurity: Not on file  ?Transportation Needs: Not on file  ?Physical Activity: Not on file  ?Stress: Not on file  ?Social Connections: Not on file  ?Intimate Partner Violence: Not on file  ? ? ?ROS ?Review of Systems  ?Constitutional:  Positive for fatigue.  ?HENT: Negative.    ?Respiratory: Negative.    ?Cardiovascular: Negative.   ?Gastrointestinal:  Positive for abdominal pain (cramping) and nausea. Negative for vomiting.  ?Endocrine: Negative for cold intolerance and heat intolerance.  ?Genitourinary:  Positive for frequency. Negative for dysuria.  ?Musculoskeletal: Negative.   ?Skin: Negative.   ?Allergic/Immunologic: Positive for environmental allergies.  ?Neurological: Negative.   ?Psychiatric/Behavioral: Negative.    ? ?Objective:  ? ?Today's Vitals: BP 130/86 (BP Location: Right Arm, Cuff Size: Normal)   Pulse 75   Temp (!) 96.8 ?F (36 ?C) (Temporal)   Ht 5\' 5"  (1.651 m)   Wt 173 lb  9.6 oz (78.7 kg)   LMP 04/10/2021 (Approximate)   SpO2 98%   BMI 28.89 kg/m?  ? ?Physical Exam ?Vitals and nursing note reviewed.  ?Constitutional:   ?   General: She is not in acute distress. ?   Appearance: Normal appearance.  ?HENT:  ?   Head: Normocephalic and atraumatic.  ?   Right Ear: Tympanic membrane, ear canal and external ear normal.  ?   Left Ear: Tympanic membrane, ear canal and external ear normal.  ?   Nose: Nose normal.  ?   Mouth/Throat:  ?   Mouth: Mucous membranes are moist.  ?   Pharynx: Oropharynx is clear.  ?Eyes:  ?   Conjunctiva/sclera: Conjunctivae normal.  ?Cardiovascular:  ?   Rate and Rhythm: Normal rate and regular rhythm.  ?   Pulses: Normal pulses.  ?   Heart sounds:  Normal heart sounds.  ?Pulmonary:  ?   Effort: Pulmonary effort is normal.  ?   Breath sounds: Normal breath sounds.  ?Abdominal:  ?   General: Bowel sounds are normal.  ?   Palpations: Abdomen is soft.  ?   Tenderness: There is abdominal tenderness (LLQ). There is no guarding or rebound. Negative signs include Murphy's sign, Rovsing's sign and McBurney's sign.  ?Musculoskeletal:     ?   General: Normal range of motion.  ?   Cervical back: Normal range of motion and neck supple. No tenderness.  ?Lymphadenopathy:  ?   Cervical: No cervical adenopathy.  ?Skin: ?   General: Skin is warm and dry.  ?Neurological:  ?   General: No focal deficit present.  ?   Mental Status: She is alert and oriented to person, place, and time.  ?   Cranial Nerves: No cranial nerve deficit.  ?   Coordination: Coordination normal.  ?   Gait: Gait normal.  ?Psychiatric:     ?   Mood and Affect: Mood normal.     ?   Behavior: Behavior normal.     ?   Thought Content: Thought content normal.     ?   Judgment: Judgment normal.  ? ? ?Assessment & Plan:  ? ?Problem List Items Addressed This Visit   ? ?  ? Other  ? Stomach pain - Primary  ?  Endorses lower pelvic abdominal cramping for the past few days.  She is concerned about possible pregnancy, she has not been taking her birth control pill as prescribed.  Urine point-of-care pregnancy negative in office today, however discussed that it could be too early for testing to be positive.  Her last menstrual period was at the beginning of March.  She is interested in a blood test for pregnancy verification, however she would like to wait a few days to make sure it is accurate.  She can come back for a lab visit to have this drawn.  She denies concern or need for STD testing.  Check CMP, CBC.  Encouraged her to stop taking birth control, stop smoking, drinking alcohol until we get the results of her pregnancy test.  Follow-up with blood hCG test. ?  ?  ? Relevant Orders  ? POCT urine pregnancy  (Completed)  ? CBC with Differential/Platelet  ? Comprehensive metabolic panel  ? Beta hCG quant (ref lab)  ? Night sweat  ?  History of night sweats, she states that she had a work-up done by her GYN several years ago that was negative.  We will check TSH today.  Denies weight loss, fevers, cough. ?  ?  ? Relevant Orders  ? TSH  ? History of gestational diabetes  ?  She has a history of gestational diabetes with her pregnancy 8 years ago.  She has noticed some increased urinary frequency in the past few days.  We will check A1c today. ?  ?  ? Relevant Orders  ? Hemoglobin A1c  ? Elevated blood pressure reading without diagnosis of hypertension  ?  Pressure elevated at 130/86.  Discussed monitoring her blood pressure at home, and limiting salt in her diet.  Also discussed smoking cessation as this can lead to elevated blood pressure.  Follow-up in 4 weeks. ?  ?  ? Tobacco use  ?  She currently smokes about 7 cigarettes/day.  Encouraged to complete tobacco cessation. ?  ?  ? ? ?Outpatient Encounter Medications as of 05/02/2021  ?Medication Sig  ? norethindrone-ethinyl estradiol (LOESTRIN) 1-20 MG-MCG tablet Junel FE 1/20 (28) 1 mg-20 mcg (21)/75 mg (7) tablet ? Take 1 tablet every day by oral route for 30 days.  ? [DISCONTINUED] benzonatate (TESSALON) 100 MG capsule Take 1 capsule (100 mg total) by mouth every 8 (eight) hours.  ? [DISCONTINUED] cetirizine (ZYRTEC) 5 MG tablet Take 1 tablet (5 mg total) by mouth daily.  ? [DISCONTINUED] Glycopyrronium Tosylate (QBREXZA) 2.4 % PADS Apply 1 patch topically daily.  ? [DISCONTINUED] hydrOXYzine (ATARAX/VISTARIL) 25 MG tablet Take 1 tablet (25 mg total) by mouth every 8 (eight) hours as needed.  ? [DISCONTINUED] NON FORMULARY   ? [DISCONTINUED] ondansetron (ZOFRAN ODT) 4 MG disintegrating tablet Take 1 tablet (4 mg total) by mouth every 8 (eight) hours as needed for nausea or vomiting.  ? [DISCONTINUED] ondansetron (ZOFRAN) 4 MG tablet Take 1 tablet (4 mg total) by mouth  every 6 (six) hours.  ? [DISCONTINUED] ondansetron (ZOFRAN) 4 MG tablet Take 1 tablet (4 mg total) by mouth every 8 (eight) hours as needed for nausea or vomiting.  ? [DISCONTINUED] UNKNOWN TO PATIENT BCPs  ? ?No facility

## 2021-05-28 NOTE — Progress Notes (Deleted)
   Established Patient Office Visit  Subjective   Patient ID: Meshell Abdulaziz, female    DOB: Oct 03, 1995  Age: 26 y.o. MRN: [id number]  No chief complaint on file.   HPI  Ahley is here to follow-up on lower abdominal cramping and elevated blood pressure reading.   Past Medical History:  Diagnosis Date   Bipolar disorder (HCC)    Gestational diabetes    OCD (obsessive compulsive disorder)    Past Surgical History:  Procedure Laterality Date   INDUCED ABORTION     Social History   Socioeconomic History   Marital status: Single    Spouse name: Not on file   Number of children: Not on file   Years of education: Not on file   Highest education level: Not on file  Occupational History   Not on file  Tobacco Use   Smoking status: Every Day    Packs/day: 0.50    Years: 10.00    Pack years: 5.00    Types: Cigarettes   Smokeless tobacco: Never  Vaping Use   Vaping Use: Never used  Substance and Sexual Activity   Alcohol use: Yes    Comment: occasionally   Drug use: Not Currently    Types: Marijuana   Sexual activity: Yes    Birth control/protection: Pill  Other Topics Concern   Not on file  Social History Narrative   Not on file   Social Determinants of Health   Financial Resource Strain: Not on file  Food Insecurity: Not on file  Transportation Needs: Not on file  Physical Activity: Not on file  Stress: Not on file  Social Connections: Not on file  Intimate Partner Violence: Not on file   Family History  Problem Relation Age of Onset   Hypertension Mother    Heart disease Paternal Grandmother       ROS    Objective:     There were no vitals taken for this visit. Wt Readings from Last 3 Encounters:  05/02/21 173 lb 9.6 oz (78.7 kg)  01/04/21 184 lb 15.5 oz (83.9 kg)  07/10/20 185 lb (83.9 kg)      Physical Exam   No results found for any visits on 05/29/21.  Last CBC Lab Results  Component Value Date   WBC 4.1 05/02/2021   HGB 13.7  05/02/2021   HCT 41.2 05/02/2021   MCV 89.6 05/02/2021   MCH 29.4 05/14/2019   RDW 14.4 05/02/2021   PLT 251.0 05/02/2021   Last metabolic panel Lab Results  Component Value Date   GLUCOSE 88 05/02/2021   NA 137 05/02/2021   K 4.6 05/02/2021   CL 103 05/02/2021   CO2 29 05/02/2021   BUN 7 05/02/2021   CREATININE 0.70 05/02/2021   GFRNONAA >60 05/14/2019   CALCIUM 9.5 05/02/2021   PROT 6.8 05/02/2021   ALBUMIN 4.4 05/02/2021   BILITOT 0.6 05/02/2021   ALKPHOS 35 (L) 05/02/2021   AST 14 05/02/2021   ALT 11 05/02/2021   ANIONGAP 11 05/14/2019   Last lipids No results found for: CHOL, HDL, LDLCALC, LDLDIRECT, TRIG, CHOLHDL Last hemoglobin A1c Lab Results  Component Value Date   HGBA1C 6.2 05/02/2021   Last thyroid functions Lab Results  Component Value Date   TSH 2.15 05/02/2021      Assessment & Plan:   Problem List Items Addressed This Visit   None   No follow-ups on file.    Gerre Scull, NP

## 2021-05-29 ENCOUNTER — Ambulatory Visit: Payer: Commercial Managed Care - PPO | Admitting: Nurse Practitioner

## 2021-05-29 ENCOUNTER — Telehealth: Payer: Self-pay | Admitting: Nurse Practitioner

## 2021-05-29 NOTE — Telephone Encounter (Signed)
?  Caller Name: makenlee mckeag ? ?Caller Ph #: [phone] ? ?Date of APPT: 05/29/21 ? ?Reason given for no show/late cancellation: pt says she cancelled this app via telephone call reminder ? ?No Show Letter printed & put in outgoing mail(Yes/No): no ? ?~ ? ? ? ?

## 2021-07-15 ENCOUNTER — Ambulatory Visit: Payer: Medicaid Other | Admitting: Family Medicine

## 2021-09-23 ENCOUNTER — Telehealth: Payer: Self-pay | Admitting: Nurse Practitioner

## 2021-09-23 ENCOUNTER — Other Ambulatory Visit: Payer: Self-pay | Admitting: Nurse Practitioner

## 2021-09-23 MED ORDER — NORETHIN ACE-ETH ESTRAD-FE 1-20 MG-MCG PO TABS: 1.0000 | ORAL_TABLET | Freq: Every day | ORAL | 3 refills | Status: DC

## 2021-09-23 MED ORDER — NORETHINDRONE ACET-ETHINYL EST 1-20 MG-MCG PO TABS: ORAL_TABLET | ORAL | 1 refills | Status: DC

## 2021-09-23 NOTE — Telephone Encounter (Signed)
Caller Name: pt Call back phone #: [phone]  MEDICATION(S): norethindrone-ethinyl estradiol (LOESTRIN) 1-20 MG-MCG tablet [[id number]]  Days of Med Remaining: she is picking up her last pack today  Has the patient contacted their pharmacy (YES/NO)? Yes  What did pharmacy advise?contact your pcp  Preferred Pharmacy: [facility] Address: [address] [place], [place] [address] Phone: [phone]

## 2021-10-31 ENCOUNTER — Telehealth: Payer: Self-pay | Admitting: Nurse Practitioner

## 2021-10-31 NOTE — Telephone Encounter (Signed)
Pt refused to say what it is about. She said "medical issues". Please call her.

## 2021-11-01 NOTE — Telephone Encounter (Signed)
Called and ldvm. Sw,cma Advised to call back directly if there are further questions. 

## 2021-12-31 ENCOUNTER — Ambulatory Visit
Admission: RE | Admit: 2021-12-31 | Discharge: 2021-12-31 | Disposition: A | Discharge status: Home / Self Care | Payer: Commercial Managed Care - PPO | Source: Ambulatory Visit | Attending: Urgent Care | Admitting: Urgent Care

## 2021-12-31 VITALS — BP 123/83 | HR 82 | Temp 98.8°F | Resp 16

## 2021-12-31 DIAGNOSIS — N898 Other specified noninflammatory disorders of vagina: Secondary | ICD-10-CM | POA: Diagnosis present

## 2021-12-31 DIAGNOSIS — N760 Acute vaginitis: Secondary | ICD-10-CM | POA: Diagnosis present

## 2021-12-31 DIAGNOSIS — B9689 Other specified bacterial agents as the cause of diseases classified elsewhere: Secondary | ICD-10-CM | POA: Diagnosis not present

## 2021-12-31 DIAGNOSIS — R102 Pelvic and perineal pain: Secondary | ICD-10-CM | POA: Diagnosis not present

## 2021-12-31 LAB — POCT URINALYSIS DIP (MANUAL ENTRY)
Bilirubin, UA: NEGATIVE
Blood, UA: NEGATIVE
Glucose, UA: NEGATIVE mg/dL
Ketones, POC UA: NEGATIVE mg/dL
Leukocytes, UA: NEGATIVE
Nitrite, UA: NEGATIVE
Protein Ur, POC: NEGATIVE mg/dL
Spec Grav, UA: 1.02 (ref 1.010–1.025)
Urobilinogen, UA: 0.2 E.U./dL
pH, UA: 6 (ref 5.0–8.0)

## 2021-12-31 LAB — POCT URINE PREGNANCY: Preg Test, Ur: NEGATIVE

## 2021-12-31 MED ORDER — FLUCONAZOLE 150 MG PO TABS: 150.0000 mg | ORAL_TABLET | ORAL | 0 refills | Status: DC

## 2021-12-31 MED ORDER — METRONIDAZOLE 0.75 % VA GEL: VAGINAL | 0 refills | Status: DC

## 2021-12-31 NOTE — ED Provider Notes (Signed)
[facility]  Note:  This document was prepared using Conservation officer, historic buildings and may include unintentional dictation errors.  MRN: [id number] DOB: 06-02-1995  Subjective:   Tiffany Brewer is a 26 y.o. female presenting for 2-day history of acute onset persistent pelvic pain, persistent clear vaginal discharge.  Has a history of BV infections and yeast infections with antibiotic use.  Had unprotected sex earlier this month.  Would like to make sure she does not have a sexually transmitted infection.  Denies fever, n/v, abdominal pain, rashes, dysuria, urinary frequency, hematuria.    No current facility-administered medications for this encounter.  Current Outpatient Medications:    norethindrone-ethinyl estradiol-FE (LOESTRIN FE 1/20) 1-20 MG-MCG tablet, Take 1 tablet by mouth daily., Disp: 84 tablet, Rfl: 3   Allergies  Allergen Reactions   Codeine     Past Medical History:  Diagnosis Date   Bipolar disorder (HCC)    Gestational diabetes    OCD (obsessive compulsive disorder)      Past Surgical History:  Procedure Laterality Date   INDUCED ABORTION      Family History  Problem Relation Age of Onset   Hypertension Mother    Heart disease Paternal Grandmother     Social History   Tobacco Use   Smoking status: Every Day    Packs/day: 0.50    Years: 10.00    Total pack years: 5.00    Types: Cigarettes   Smokeless tobacco: Never  Vaping Use   Vaping Use: Never used  Substance Use Topics   Alcohol use: Yes    Comment: occasionally   Drug use: Not Currently    Types: Marijuana    ROS   Objective:   Vitals: BP 123/83 (BP Location: Right Arm)   Pulse 82   Temp 98.8 F (37.1 C) (Oral)   Resp 16   LMP 12/09/2021   SpO2 98%   Physical Exam Constitutional:      General: She is not in acute distress.    Appearance: Normal appearance. She is well-developed. She is not ill-appearing, toxic-appearing or diaphoretic.  HENT:      Head: Normocephalic and atraumatic.     Nose: Nose normal.     Mouth/Throat:     Mouth: Mucous membranes are moist.     Pharynx: Oropharynx is clear.  Eyes:     General: No scleral icterus.       Right eye: No discharge.        Left eye: No discharge.     Extraocular Movements: Extraocular movements intact.     Conjunctiva/sclera: Conjunctivae normal.  Cardiovascular:     Rate and Rhythm: Normal rate.  Pulmonary:     Effort: Pulmonary effort is normal.  Abdominal:     General: Bowel sounds are normal. There is no distension.     Palpations: Abdomen is soft. There is no mass.     Tenderness: There is abdominal tenderness in the suprapubic area. There is no right CVA tenderness, left CVA tenderness, guarding or rebound.    Skin:    General: Skin is warm and dry.  Neurological:     General: No focal deficit present.     Mental Status: She is alert and oriented to person, place, and time.  Psychiatric:        Mood and Affect: Mood normal.        Behavior: Behavior normal.        Thought Content: Thought content normal.  Judgment: Judgment normal.     Results for orders placed or performed during the hospital encounter of 12/31/21 (from the past 24 hour(s))  POCT urinalysis dipstick     Status: None   Collection Time: 12/31/21 10:14 AM  Result Value Ref Range   Color, UA yellow yellow   Clarity, UA clear clear   Glucose, UA negative negative mg/dL   Bilirubin, UA negative negative   Ketones, POC UA negative negative mg/dL   Spec Grav, UA 0.601 5.615 - 1.025   Blood, UA negative negative   pH, UA 6.0 5.0 - 8.0   Protein Ur, POC negative negative mg/dL   Urobilinogen, UA 0.2 0.2 or 1.0 E.U./dL   Nitrite, UA Negative Negative   Leukocytes, UA Negative Negative  POCT urine pregnancy     Status: None   Collection Time: 12/31/21 10:14 AM  Result Value Ref Range   Preg Test, Ur Negative Negative    Assessment and Plan :   PDMP not reviewed this encounter.  1.  Bacterial vaginosis   2. Pelvic pain   3. Vaginal discharge     We will treat empirically for bacterial vaginosis that is recurrent with MetroGel at patient's request.  Use oral fluconazole for secondary antibiotic associated yeast infection.  Labs pending, will treat as appropriate otherwise.  Low suspicion for PID. Counseled patient on potential for adverse effects with medications prescribed/recommended today, ER and return-to-clinic precautions discussed, patient verbalized understanding.    Wallis Bamberg, PA-C 12/31/21 1026

## 2021-12-31 NOTE — ED Triage Notes (Addendum)
Pt presents with lower abd pain. Denies chills, fever, and urinary symptoms. Reports lower back pain as well. LBM was this AM.  Onset 2 days ago. OTC medicine: none

## 2022-01-01 LAB — CERVICOVAGINAL ANCILLARY ONLY
Bacterial Vaginitis (gardnerella): POSITIVE — AB
Candida Glabrata: NEGATIVE
Candida Vaginitis: NEGATIVE
Chlamydia: NEGATIVE
Comment: NEGATIVE
Comment: NEGATIVE
Comment: NEGATIVE
Comment: NEGATIVE
Comment: NEGATIVE
Comment: NORMAL
Neisseria Gonorrhea: NEGATIVE
Trichomonas: NEGATIVE

## 2022-02-12 ENCOUNTER — Ambulatory Visit
Admission: RE | Admit: 2022-02-12 | Discharge: 2022-02-12 | Disposition: A | Discharge status: Home / Self Care | Payer: Commercial Managed Care - PPO | Source: Ambulatory Visit | Attending: Physician Assistant | Admitting: Physician Assistant

## 2022-02-12 VITALS — BP 134/97 | HR 86 | Temp 98.5°F | Resp 16

## 2022-02-12 DIAGNOSIS — J0140 Acute pansinusitis, unspecified: Secondary | ICD-10-CM

## 2022-02-12 MED ORDER — AMOXICILLIN-POT CLAVULANATE 875-125 MG PO TABS: 1.0000 | ORAL_TABLET | Freq: Two times a day (BID) | ORAL | 0 refills | Status: DC

## 2022-02-12 NOTE — ED Triage Notes (Signed)
Pt c/o congestion, headache and facial pressure.  Started: 5 days ago   Home interventions: OTC decongestants

## 2022-02-12 NOTE — ED Provider Notes (Signed)
[facility]    CSN: [id number] Arrival date & time: 02/12/22  1809      History   Chief Complaint Chief Complaint  Patient presents with   Nasal Congestion    Sinus infection - Entered by patient   Facial Pain    HPI Adamarys Shall is a 27 y.o. female.   Patient presents today with a 5 to 6-day history of URI symptoms including congestion, facial pressure, sinus pressure, headache.  Denies any fever, cough, shortness of breath, nausea, vomiting, diarrhea.  She has tried over-the-counter ibuprofen as well as cold/flu medication without improvement of symptoms.  Denies any known sick contacts.  Denies any recent antibiotics or steroids.  She denies any significant past medical history including allergies, asthma, COPD.  Reports that she does have a history of recurrent sinus infections with similar presentation.  Symptoms have been worsening prompting evaluation today.    Past Medical History:  Diagnosis Date   Bipolar disorder ([place])    Gestational diabetes    OCD (obsessive compulsive disorder)     Patient Active Problem List   Diagnosis Date Noted   Stomach pain 05/02/2021   Night sweat 05/02/2021   History of gestational diabetes 05/02/2021   Elevated blood pressure reading without diagnosis of hypertension 05/02/2021   Tobacco use 05/02/2021    Past Surgical History:  Procedure Laterality Date   INDUCED ABORTION      OB History   No obstetric history on file.      Home Medications    Prior to Admission medications   Medication Sig Start Date End Date Taking? Authorizing Provider  amoxicillin-clavulanate (AUGMENTIN) 875-125 MG tablet Take 1 tablet by mouth every 12 (twelve) hours. 02/12/22  Yes Ubaldo Daywalt K, PA-C  norethindrone-ethinyl estradiol-FE (LOESTRIN FE 1/20) 1-20 MG-MCG tablet Take 1 tablet by mouth daily. 09/23/21   McElwee, Scheryl Darter, NP    Family History Family History  Problem Relation Age of Onset   Hypertension Mother    Heart  disease Paternal Grandmother     Social History Social History   Tobacco Use   Smoking status: Every Day    Packs/day: 0.50    Years: 10.00    Total pack years: 5.00    Types: Cigarettes   Smokeless tobacco: Never  Vaping Use   Vaping Use: Never used  Substance Use Topics   Alcohol use: Yes    Comment: occasionally   Drug use: Not Currently    Types: Marijuana     Allergies   Codeine   Review of Systems Review of Systems  Constitutional:  Positive for activity change. Negative for appetite change, fatigue and fever.  HENT:  Positive for congestion, postnasal drip, sinus pressure and sore throat. Negative for sneezing.   Respiratory:  Negative for cough and shortness of breath.   Cardiovascular:  Negative for chest pain.  Gastrointestinal:  Negative for abdominal pain, diarrhea, nausea and vomiting.  Neurological:  Positive for headaches. Negative for dizziness and light-headedness.     Physical Exam Triage Vital Signs ED Triage Vitals  Enc Vitals Group     BP 02/12/22 1819 (!) 134/97     Pulse Rate 02/12/22 1819 86     Resp 02/12/22 1819 16     Temp 02/12/22 1819 98.5 F (36.9 C)     Temp Source 02/12/22 1819 Oral     SpO2 02/12/22 1819 98 %     Weight --      Height --  Head Circumference --      Peak Flow --      Pain Score 02/12/22 1818 0     Pain Loc --      Pain Edu? --      Excl. in GC? --    No data found.  Updated Vital Signs BP (!) 134/97 (BP Location: Left Arm)   Pulse 86   Temp 98.5 F (36.9 C) (Oral)   Resp 16   LMP 01/17/2022   SpO2 98%   Visual Acuity Right Eye Distance:   Left Eye Distance:   Bilateral Distance:    Right Eye Near:   Left Eye Near:    Bilateral Near:     Physical Exam Vitals reviewed.  Constitutional:      General: She is awake. She is not in acute distress.    Appearance: Normal appearance. She is well-developed. She is not ill-appearing.     Comments: Very pleasant female appears stated age in no  acute distress sitting comfortably in exam room  HENT:     Head: Normocephalic and atraumatic.     Right Ear: Tympanic membrane, ear canal and external ear normal. Tympanic membrane is not erythematous or bulging.     Left Ear: Tympanic membrane, ear canal and external ear normal. Tympanic membrane is not erythematous or bulging.     Nose:     Right Sinus: Maxillary sinus tenderness present. No frontal sinus tenderness.     Left Sinus: Maxillary sinus tenderness present. No frontal sinus tenderness.     Mouth/Throat:     Pharynx: Uvula midline. Posterior oropharyngeal erythema present. No oropharyngeal exudate.     Comments: Erythema and drainage in posterior oropharynx Cardiovascular:     Rate and Rhythm: Normal rate and regular rhythm.     Heart sounds: Normal heart sounds, S1 normal and S2 normal. No murmur heard. Pulmonary:     Effort: Pulmonary effort is normal.     Breath sounds: Normal breath sounds. No wheezing, rhonchi or rales.     Comments: Clear to auscultation bilaterally Psychiatric:        Behavior: Behavior is cooperative.      UC Treatments / Results  Labs (all labs ordered are listed, but only abnormal results are displayed) Labs Reviewed - No data to display  EKG   Radiology No results found.  Procedures Procedures (including critical care time)  Medications Ordered in UC Medications - No data to display  Initial Impression / Assessment and Plan / UC Course  I have reviewed the triage vital signs and the nursing notes.  Pertinent labs & imaging results that were available during my care of the patient were reviewed by me and considered in my medical decision making (see chart for details).     Patient is well-appearing, afebrile, nontoxic, nontachycardic.  No indication for viral testing as she has been symptomatic for 5+ days and this would not change management.  We initially discussed that this is likely a virus that has to run its course,  however, patient reports that she is having worsening symptoms and knows that she needs an antibiotic.  After long discussion we did discuss starting an antibiotic given her worsening of symptoms.  Augmentin was sent to pharmacy which was encouraged to continue using over-the-counter medication including Mucinex, Flonase, Tylenol.  Discussed that if she has any worsening or changing symptoms she needs to be seen immediately.  Strict return precautions given.  Patient declined work excuse note.  Final  Clinical Impressions(s) / UC Diagnoses   Final diagnoses:  Acute non-recurrent pansinusitis     Discharge Instructions      We are going to treat you for sinus infection.  Start Augmentin twice daily for 7 days.  If you are taking birth control please use backup birth control while on this medication as it can decrease the effectiveness of this medicine.  Use over-the-counter medication including Mucinex, Flonase, Tylenol.  Make sure you rest and drink plenty of fluid.  I also recommend nasal saline and sinus rinses for additional symptom relief.  If your symptoms are proving or if anything changes or worsens please return for reevaluation.     ED Prescriptions     Medication Sig Dispense Auth. Provider   amoxicillin-clavulanate (AUGMENTIN) 875-125 MG tablet Take 1 tablet by mouth every 12 (twelve) hours. 14 tablet Malayah Demuro, Derry Skill, PA-C      PDMP not reviewed this encounter.   Terrilee Croak, PA-C 02/12/22 1842

## 2022-02-12 NOTE — Discharge Instructions (Signed)
We are going to treat you for sinus infection.  Start Augmentin twice daily for 7 days.  If you are taking birth control please use backup birth control while on this medication as it can decrease the effectiveness of this medicine.  Use over-the-counter medication including Mucinex, Flonase, Tylenol.  Make sure you rest and drink plenty of fluid.  I also recommend nasal saline and sinus rinses for additional symptom relief.  If your symptoms are proving or if anything changes or worsens please return for reevaluation.

## 2022-03-10 NOTE — Progress Notes (Unsigned)
   Acute Office Visit  Subjective:     Patient ID: Tiffany Brewer, female    DOB: Jun 30, 1995, 27 y.o.   MRN: [id number]  No chief complaint on file.   HPI Patient is in today for ***  ROS      Objective:    LMP 01/17/2022  {Vitals History (Optional):23777}  Physical Exam  No results found for any visits on 03/11/22.      Assessment & Plan:   Problem List Items Addressed This Visit   None   No orders of the defined types were placed in this encounter.   No follow-ups on file.  Charyl Dancer, NP

## 2022-03-11 ENCOUNTER — Ambulatory Visit (INDEPENDENT_AMBULATORY_CARE_PROVIDER_SITE_OTHER): Payer: Commercial Managed Care - PPO | Admitting: Nurse Practitioner

## 2022-03-11 ENCOUNTER — Encounter: Payer: Self-pay | Admitting: Nurse Practitioner

## 2022-03-11 VITALS — BP 114/80 | HR 67 | Temp 96.6°F | Ht 65.0 in | Wt 167.0 lb

## 2022-03-11 DIAGNOSIS — B9689 Other specified bacterial agents as the cause of diseases classified elsewhere: Secondary | ICD-10-CM | POA: Diagnosis not present

## 2022-03-11 DIAGNOSIS — N760 Acute vaginitis: Secondary | ICD-10-CM

## 2022-03-11 MED ORDER — METRONIDAZOLE 500 MG PO TABS: 500.0000 mg | ORAL_TABLET | Freq: Two times a day (BID) | ORAL | 0 refills | Status: AC

## 2022-03-11 NOTE — Patient Instructions (Addendum)
It was great to see you!  Start flagyl twice a day, do not drink alcohol while taking this.   Let's follow-up in 2 months ongoing slight vaginal, sooner if you have concerns.  If a referral was placed today, you will be contacted for an appointment. Please note that routine referrals can sometimes take up to 3-4 weeks to process. Please call our office if you haven't heard anything after this time frame.  Take care,  Vance Peper, NP

## 2022-03-18 IMAGING — US US PELVIS COMPLETE WITH TRANSVAGINAL
1 series · 14 of 25 positions shown · non-contrast
Comparison: October 07, 2015

CLINICAL DATA: Pelvic pain, primarily on the right

EXAM:
TRANSABDOMINAL AND TRANSVAGINAL ULTRASOUND OF PELVIS
TECHNIQUE: Study was performed transabdominally to optimize pelvic field of
view evaluation and transvaginally to optimize internal visceral
architecture evaluation.

[Series 1: us pelvis complete with transvaginal · 14 of 77 slices shown]
[im 1/77]
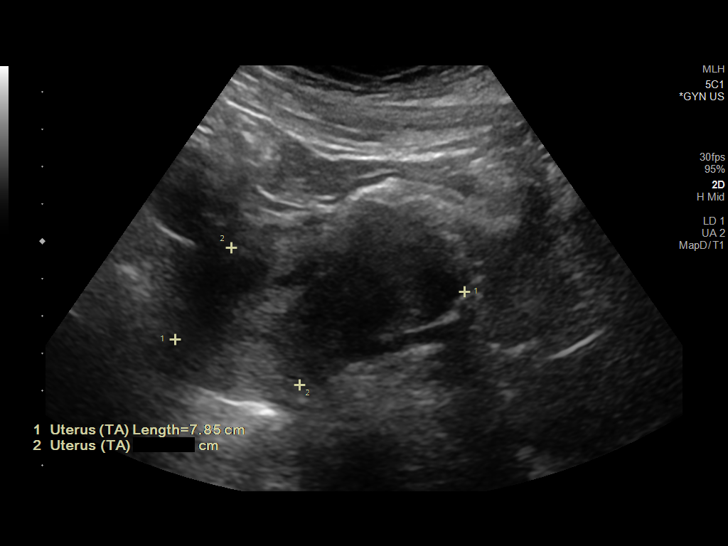
[im 7/77]
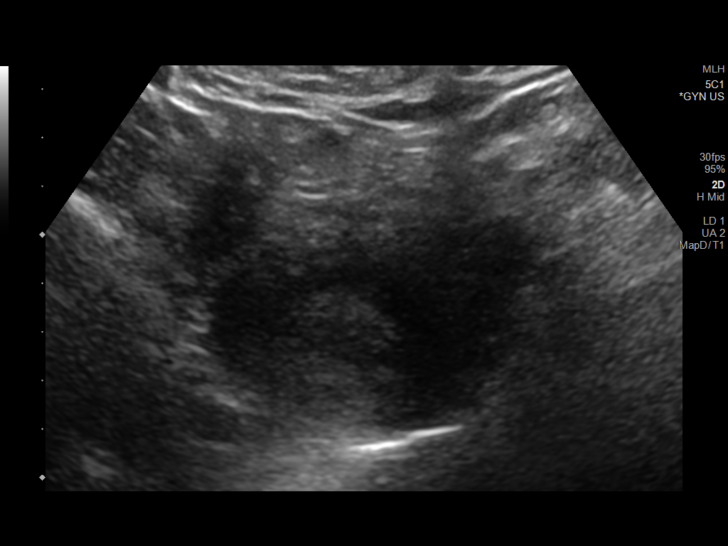
[im 13/77]
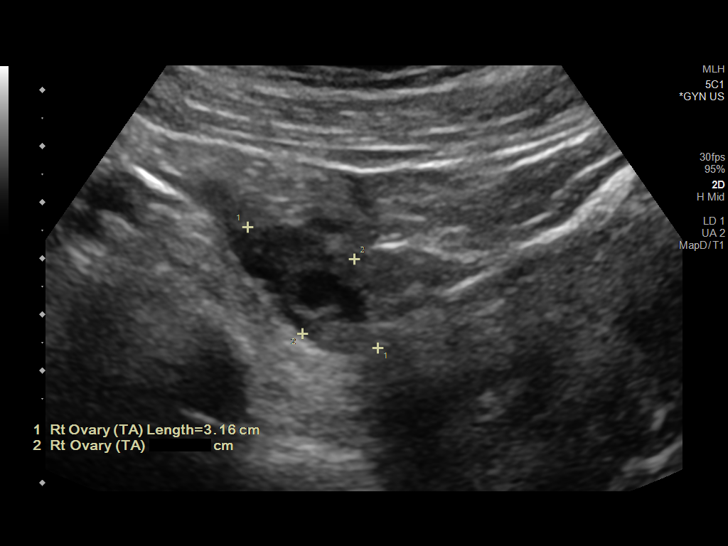
[im 20/77]
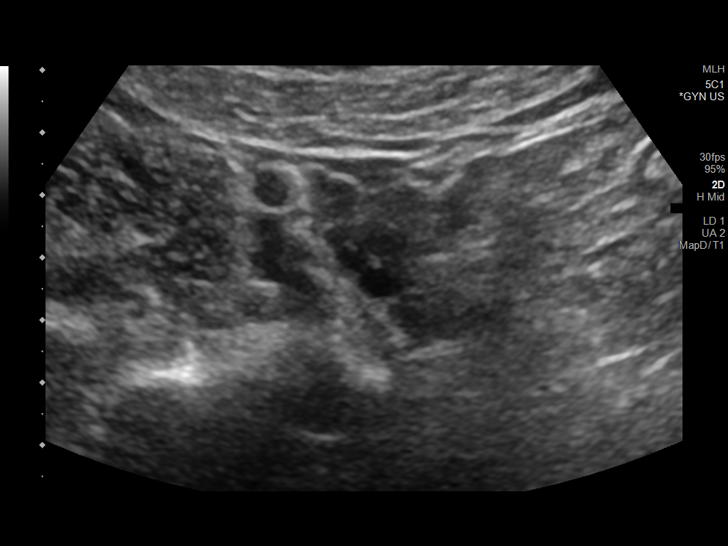
[im 26/77]
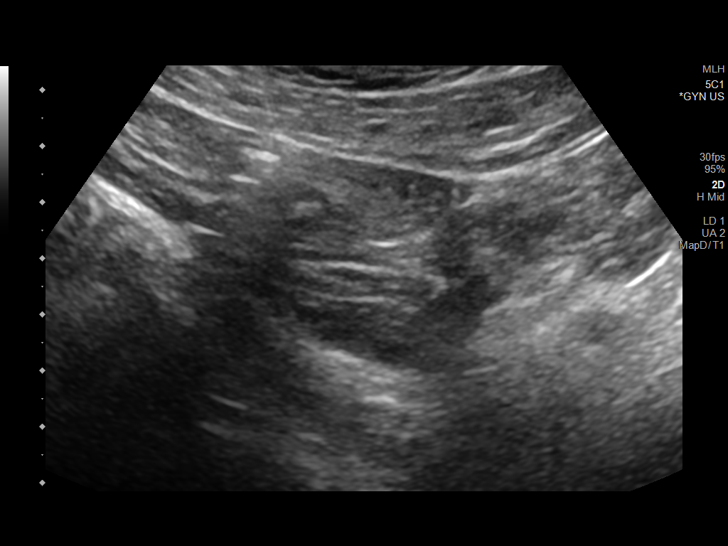
[im 29/77]
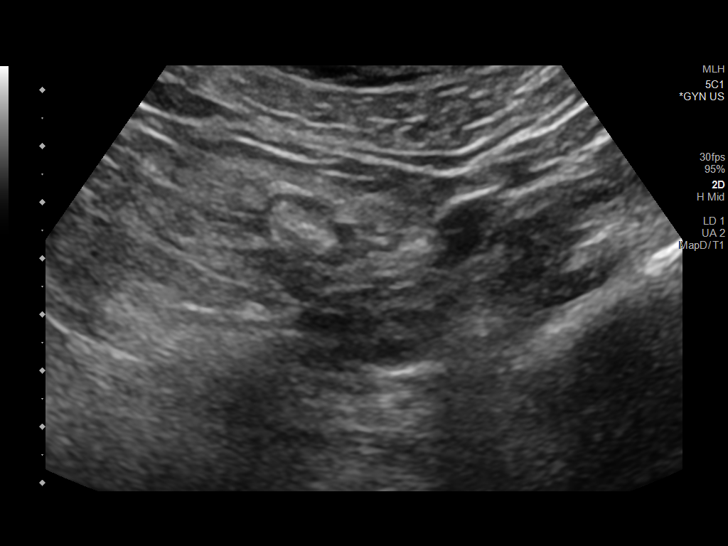
[im 35/77]
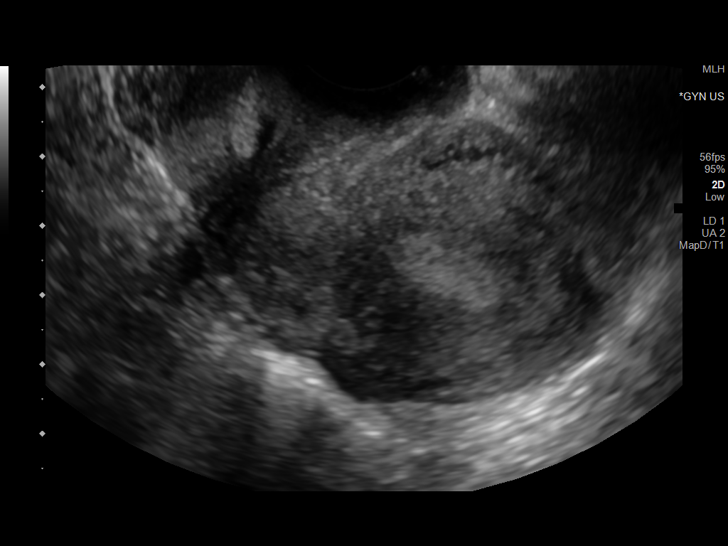
[im 42/77]
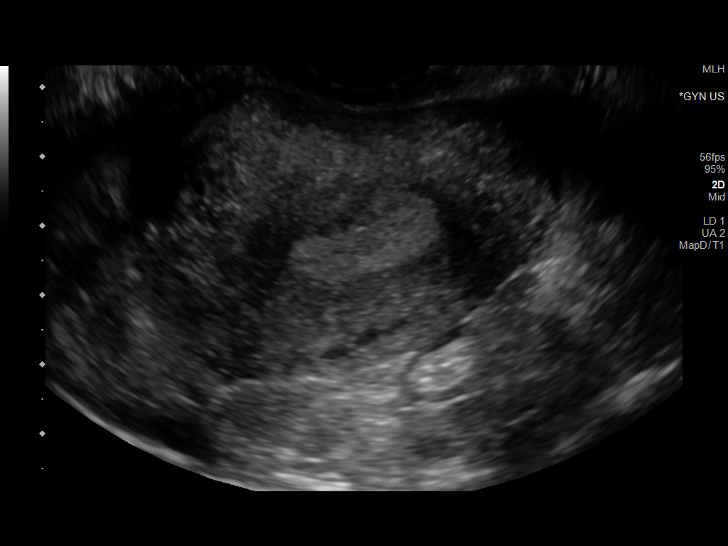
[im 48/77]
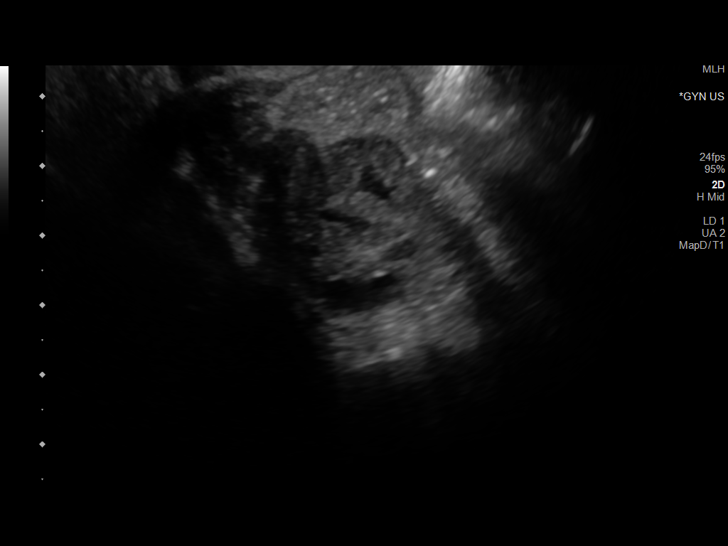
[im 51/77]
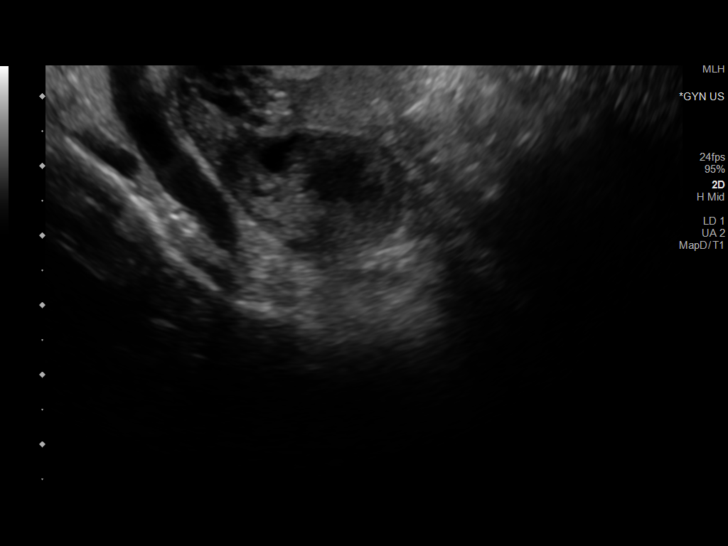
[im 58/77]
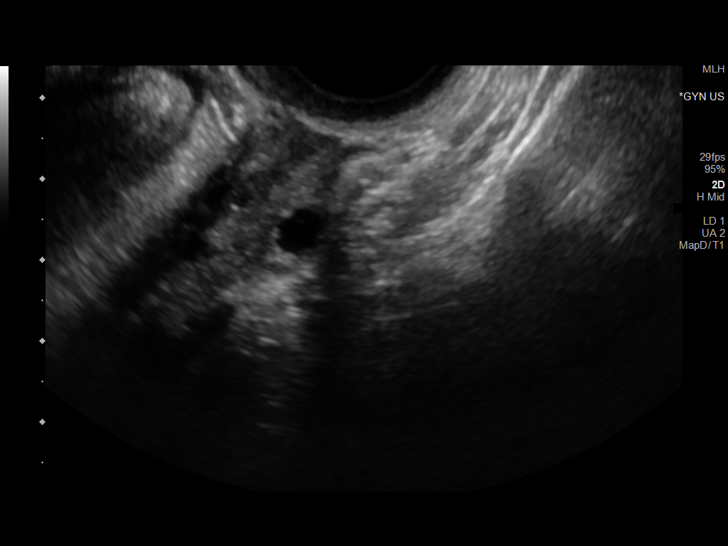
[im 64/77]
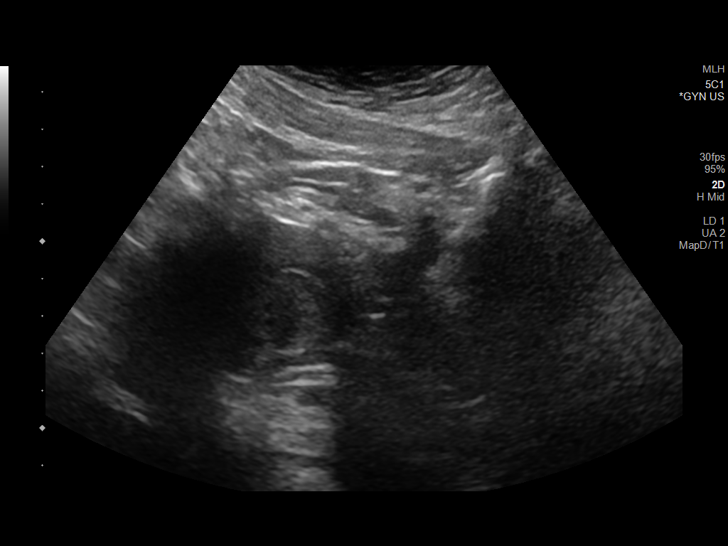
[im 70/77]
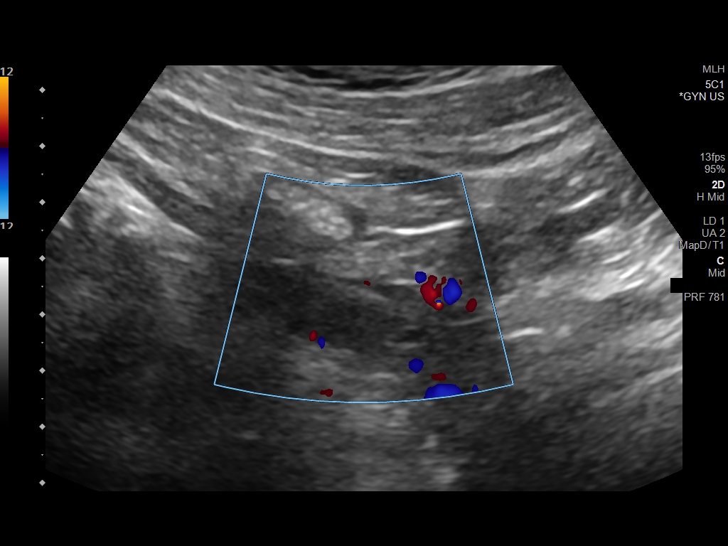
[im 77/77]
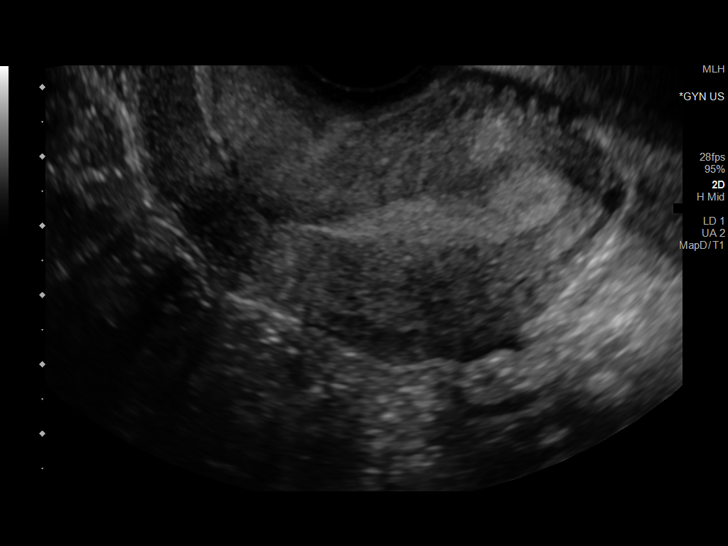

[14 of 25 positions shown; findings below may reference images not displayed]

FINDINGS: Uterus

Measurements: 7.3 x 4.2 x 5.1 cm = volume: 81 mL. No fibroids or
other mass visualized.

Endometrium

Thickness: 9 mm.  No focal abnormality visualized.

Right ovary

Measurements: 3.3 x 2.0 x 2.5 cm = volume: 9 mL. Normal
appearance/no adnexal mass.

Left ovary

Measurements: 3.1 x 1.5 x 1.5 cm = volume: 4 mL. Normal
appearance/no adnexal mass.

Other findings

No abnormal free fluid.
IMPRESSION: Study within normal limits.

## 2022-04-24 ENCOUNTER — Encounter: Payer: Self-pay | Admitting: Nurse Practitioner

## 2022-04-24 ENCOUNTER — Other Ambulatory Visit (HOSPITAL_COMMUNITY)
Admission: RE | Admit: 2022-04-24 | Discharge: 2022-04-24 | Disposition: A | Discharge status: Home / Self Care | Payer: Commercial Managed Care - PPO | Source: Ambulatory Visit | Attending: Nurse Practitioner | Admitting: Nurse Practitioner

## 2022-04-24 ENCOUNTER — Ambulatory Visit (INDEPENDENT_AMBULATORY_CARE_PROVIDER_SITE_OTHER): Payer: Commercial Managed Care - PPO | Admitting: Nurse Practitioner

## 2022-04-24 VITALS — BP 118/80 | HR 73 | Temp 98.9°F | Ht 65.0 in | Wt 163.6 lb

## 2022-04-24 DIAGNOSIS — Z136 Encounter for screening for cardiovascular disorders: Secondary | ICD-10-CM

## 2022-04-24 DIAGNOSIS — Z1159 Encounter for screening for other viral diseases: Secondary | ICD-10-CM

## 2022-04-24 DIAGNOSIS — N898 Other specified noninflammatory disorders of vagina: Secondary | ICD-10-CM

## 2022-04-24 DIAGNOSIS — R7303 Prediabetes: Secondary | ICD-10-CM

## 2022-04-24 DIAGNOSIS — Z124 Encounter for screening for malignant neoplasm of cervix: Secondary | ICD-10-CM | POA: Diagnosis present

## 2022-04-24 DIAGNOSIS — Z720 Tobacco use: Secondary | ICD-10-CM

## 2022-04-24 DIAGNOSIS — Z0000 Encounter for general adult medical examination without abnormal findings: Secondary | ICD-10-CM

## 2022-04-24 LAB — CBC WITH DIFFERENTIAL/PLATELET
Basophils Absolute: 0 10*3/uL (ref 0.0–0.1)
Basophils Relative: 0.6 % (ref 0.0–3.0)
Eosinophils Absolute: 0.1 10*3/uL (ref 0.0–0.7)
Eosinophils Relative: 2.3 % (ref 0.0–5.0)
HCT: 41.2 % (ref 36.0–46.0)
Hemoglobin: 13.8 g/dL (ref 12.0–15.0)
Lymphocytes Relative: 42.7 % (ref 12.0–46.0)
Lymphs Abs: 1.9 10*3/uL (ref 0.7–4.0)
MCHC: 33.6 g/dL (ref 30.0–36.0)
MCV: 89 fl (ref 78.0–100.0)
Monocytes Absolute: 0.4 10*3/uL (ref 0.1–1.0)
Monocytes Relative: 8.3 % (ref 3.0–12.0)
Neutro Abs: 2 10*3/uL (ref 1.4–7.7)
Neutrophils Relative %: 46.1 % (ref 43.0–77.0)
Platelets: 260 10*3/uL (ref 150.0–400.0)
RBC: 4.63 Mil/uL (ref 3.87–5.11)
RDW: 13.3 % (ref 11.5–15.5)
WBC: 4.4 10*3/uL (ref 4.0–10.5)

## 2022-04-24 LAB — HEMOGLOBIN A1C: Hgb A1c MFr Bld: 6.1 % (ref 4.6–6.5)

## 2022-04-24 LAB — LIPID PANEL
Cholesterol: 217 mg/dL — ABNORMAL HIGH (ref 0–200)
HDL: 58.5 mg/dL (ref >39.00)
LDL Cholesterol: 143 mg/dL — ABNORMAL HIGH (ref 0–99)
NonHDL: 158.02
Total CHOL/HDL Ratio: 4
Triglycerides: 75 mg/dL (ref 0.0–149.0)
VLDL: 15 mg/dL (ref 0.0–40.0)

## 2022-04-24 LAB — COMPREHENSIVE METABOLIC PANEL
ALT: 7 U/L (ref 0–35)
AST: 10 U/L (ref 0–37)
Albumin: 4.2 g/dL (ref 3.5–5.2)
Alkaline Phosphatase: 31 U/L — ABNORMAL LOW (ref 39–117)
BUN: 9 mg/dL (ref 6–23)
CO2: 26 mEq/L (ref 19–32)
Calcium: 9.6 mg/dL (ref 8.4–10.5)
Chloride: 104 mEq/L (ref 96–112)
Creatinine, Ser: 0.73 mg/dL (ref 0.40–1.20)
GFR: 111.46 mL/min (ref >60.00)
Glucose, Bld: 106 mg/dL — ABNORMAL HIGH (ref 70–99)
Potassium: 4.2 mEq/L (ref 3.5–5.1)
Sodium: 137 mEq/L (ref 135–145)
Total Bilirubin: 0.6 mg/dL (ref 0.2–1.2)
Total Protein: 6.9 g/dL (ref 6.0–8.3)

## 2022-04-24 LAB — TSH: TSH: 2.67 u[IU]/mL (ref 0.35–5.50)

## 2022-04-24 NOTE — Progress Notes (Signed)
BP 118/80 (BP Location: Left Arm)   Pulse 73   Temp 98.9 F (37.2 C)   Ht '5\' 5"'$  (1.651 m)   Wt 163 lb 9.6 oz (74.2 kg)   LMP 04/10/2022   SpO2 99%   BMI 27.22 kg/m    Subjective:    Patient ID: Tiffany Brewer, female    DOB: 05-20-1995, 27 y.o.   MRN: [id number]  CC: Chief Complaint  Patient presents with   Annual Exam    With PAP, No concerns    HPI: Tiffany Brewer is a 27 y.o. female presenting on 04/24/2022 for comprehensive medical examination. Current medical complaints include:none  She currently lives with: daughter Menopausal Symptoms: no  Depression and Anxiety Screen done today and results listed below:     04/24/2022    8:32 AM 05/02/2021    9:39 AM  Depression screen PHQ 2/9  Decreased Interest 0 0  Down, Depressed, Hopeless 0 0  PHQ - 2 Score 0 0  Altered sleeping 0 0  Tired, decreased energy 0 0  Change in appetite 0 0  Feeling bad or failure about yourself  0 0  Trouble concentrating 0 0  Moving slowly or fidgety/restless 0 0  Suicidal thoughts 0 0  PHQ-9 Score 0 0  Difficult doing work/chores Not difficult at all Not difficult at all      04/24/2022    8:33 AM 05/02/2021    9:41 AM  GAD 7 : Generalized Anxiety Score  Nervous, Anxious, on Edge 0 0  Control/stop worrying 0 0  Worry too much - different things 0 0  Trouble relaxing 0 0  Restless 0 0  Easily annoyed or irritable 0 0  Afraid - awful might happen 0 0  Total GAD 7 Score 0 0  Anxiety Difficulty Not difficult at all Not difficult at all    The patient does not have a history of falls. I did not complete a risk assessment for falls. A plan of care for falls was not documented.   Past Medical History:  Past Medical History:  Diagnosis Date   Bipolar disorder ([place])    Gestational diabetes    OCD (obsessive compulsive disorder)     Surgical History:  Past Surgical History:  Procedure Laterality Date   INDUCED ABORTION      Medications:  Current Outpatient Medications on  File Prior to Visit  Medication Sig   norethindrone-ethinyl estradiol-FE (LOESTRIN FE 1/20) 1-20 MG-MCG tablet Take 1 tablet by mouth daily.   No current facility-administered medications on file prior to visit.    Allergies:  Allergies  Allergen Reactions   Codeine     Social History:  Social History   Socioeconomic History   Marital status: Single    Spouse name: Not on file   Number of children: Not on file   Years of education: Not on file   Highest education level: Not on file  Occupational History   Not on file  Tobacco Use   Smoking status: Every Day    Packs/day: 0.50    Years: 10.00    Additional pack years: 0.00    Total pack years: 5.00    Types: Cigarettes   Smokeless tobacco: Never  Vaping Use   Vaping Use: Never used  Substance and Sexual Activity   Alcohol use: Yes    Comment: occasionally   Drug use: Not Currently    Types: Marijuana   Sexual activity: Yes    Birth control/protection: Pill  Other Topics Concern   Not on file  Social History Narrative   Not on file   Social Determinants of Health   Financial Resource Strain: Not on file  Food Insecurity: Not on file  Transportation Needs: Not on file  Physical Activity: Not on file  Stress: Not on file  Social Connections: Not on file  Intimate Partner Violence: Not on file   Social History   Tobacco Use  Smoking Status Every Day   Packs/day: 0.50   Years: 10.00   Additional pack years: 0.00   Total pack years: 5.00   Types: Cigarettes  Smokeless Tobacco Never   Social History   Substance and Sexual Activity  Alcohol Use Yes   Comment: occasionally    Family History:  Family History  Problem Relation Age of Onset   Cancer Mother        cervical   Hypertension Mother    Heart disease Paternal Grandmother     Past medical history, surgical history, medications, allergies, family history and social history reviewed with patient today and changes made to appropriate areas  of the chart.   Review of Systems  Constitutional: Negative.   HENT: Negative.    Eyes: Negative.   Respiratory: Negative.    Cardiovascular: Negative.   Gastrointestinal: Negative.   Genitourinary:  Negative for urgency.       Vaginal itching  Musculoskeletal: Negative.   Skin: Negative.   Neurological: Negative.   Psychiatric/Behavioral: Negative.     All other ROS negative except what is listed above and in the HPI.      Objective:    BP 118/80 (BP Location: Left Arm)   Pulse 73   Temp 98.9 F (37.2 C)   Ht '5\' 5"'$  (1.651 m)   Wt 163 lb 9.6 oz (74.2 kg)   LMP 04/10/2022   SpO2 99%   BMI 27.22 kg/m   Wt Readings from Last 3 Encounters:  04/24/22 163 lb 9.6 oz (74.2 kg)  03/11/22 167 lb (75.8 kg)  05/02/21 173 lb 9.6 oz (78.7 kg)    Physical Exam Vitals and nursing note reviewed. Exam conducted with a chaperone present.  Constitutional:      General: She is not in acute distress.    Appearance: Normal appearance.  HENT:     Head: Normocephalic and atraumatic.     Right Ear: Tympanic membrane, ear canal and external ear normal.     Left Ear: Tympanic membrane, ear canal and external ear normal.  Eyes:     Conjunctiva/sclera: Conjunctivae normal.  Cardiovascular:     Rate and Rhythm: Normal rate and regular rhythm.     Pulses: Normal pulses.     Heart sounds: Normal heart sounds.  Pulmonary:     Effort: Pulmonary effort is normal.     Breath sounds: Normal breath sounds.  Abdominal:     Palpations: Abdomen is soft.     Tenderness: There is no abdominal tenderness.  Genitourinary:    General: Normal vulva.     Exam position: Lithotomy position.     Labia:        Right: No rash, tenderness or lesion.        Left: No rash, tenderness or lesion.      Vagina: Normal.     Cervix: Normal.     Uterus: Normal.      Adnexa: Right adnexa normal and left adnexa normal.  Musculoskeletal:        General: Normal range of  motion.     Cervical back: Normal range of  motion and neck supple.     Right lower leg: No edema.     Left lower leg: No edema.  Lymphadenopathy:     Cervical: No cervical adenopathy.  Skin:    General: Skin is warm and dry.  Neurological:     General: No focal deficit present.     Mental Status: She is alert and oriented to person, place, and time.     Cranial Nerves: No cranial nerve deficit.     Coordination: Coordination normal.     Gait: Gait normal.  Psychiatric:        Mood and Affect: Mood normal.        Behavior: Behavior normal.        Thought Content: Thought content normal.        Judgment: Judgment normal.     Results for orders placed or performed in visit on 04/24/22  Lipid panel  Result Value Ref Range   Triglycerides 106 40 - 160   Cholesterol 206 (A) 0 - 200   HDL 68 35 - 70   LDL Cholesterol 117       Assessment & Plan:   Problem List Items Addressed This Visit       Other   Tobacco use    She currently smokes about 7 cigarettes/day.  Encouraged to complete tobacco cessation.      Prediabetes    Last A1c was 6.2%. Will check A1c today and treat based on results.       Relevant Orders   Hemoglobin A1c   Routine general medical examination at a health care facility - Primary    Health maintenance reviewed and updated. Discussed nutrition, exercise. Check CMP, CBC, TSH today. Follow-up 1 year.        Relevant Orders   CBC with Differential/Platelet   Comprehensive metabolic panel   TSH   Other Visit Diagnoses     Encounter for hepatitis C screening test for low risk patient       Screen hepatitis C today   Relevant Orders   Hepatitis C antibody   Screening for cardiovascular condition       Screen lipid panel today   Relevant Orders   Lipid panel   Screening for cervical cancer       Pap done today   Relevant Orders   Cytology - PAP   Vaginal itching       Check swab for BV and yeast. Treat based on results.   Relevant Orders   Cervicovaginal ancillary only         Follow up plan: Return in about 1 year (around 04/24/2023) for CPE.   LABORATORY TESTING:  - Pap smear: pap done  IMMUNIZATIONS:   - Tdap: Tetanus vaccination status reviewed: declines today. - Influenza: Up to date - Pneumovax: Not applicable - Prevnar: Not applicable - HPV: Up to date - Zostavax vaccine: Not applicable  SCREENING: -Mammogram: Not applicable  - Colonoscopy: Not applicable  - Bone Density: Not applicable  -Hearing Test: Not applicable  -Spirometry: Not applicable   PATIENT COUNSELING:   Advised to take 1 mg of folate supplement per day if capable of pregnancy.   Sexuality: Discussed sexually transmitted diseases, partner selection, use of condoms, avoidance of unintended pregnancy  and contraceptive alternatives.   Advised to avoid cigarette smoking.  I discussed with the patient that most people either abstain from alcohol or drink within safe  limits (<=14/week and <=4 drinks/occasion for males, <=7/weeks and <= 3 drinks/occasion for females) and that the risk for alcohol disorders and other health effects rises proportionally with the number of drinks per week and how often a drinker exceeds daily limits.  Discussed cessation/primary prevention of drug use and availability of treatment for abuse.   Diet: Encouraged to adjust caloric intake to maintain  or achieve ideal body weight, to reduce intake of dietary saturated fat and total fat, to limit sodium intake by avoiding high sodium foods and not adding table salt, and to maintain adequate dietary potassium and calcium preferably from fresh fruits, vegetables, and low-fat dairy products.    stressed the importance of regular exercise  Injury prevention: Discussed safety belts, safety helmets, smoke detector, smoking near bedding or upholstery.   Dental health: Discussed importance of regular tooth brushing, flossing, and dental visits.    NEXT PREVENTATIVE PHYSICAL DUE IN 1 YEAR. Return in about 1  year (around 04/24/2023) for CPE.

## 2022-04-24 NOTE — Assessment & Plan Note (Signed)
Last A1c was 6.2%. Will check A1c today and treat based on results.

## 2022-04-24 NOTE — Patient Instructions (Signed)
It was great to see you!  We are checking your labs today and will let you know the results via mychart/phone.   Let's follow-up in 1 year, sooner if you have concerns.  If a referral was placed today, you will be contacted for an appointment. Please note that routine referrals can sometimes take up to 3-4 weeks to process. Please call our office if you haven't heard anything after this time frame.  Take care,  Kaimana Neuzil, NP  

## 2022-04-24 NOTE — Assessment & Plan Note (Signed)
She currently smokes about 7 cigarettes/day.  Encouraged to complete tobacco cessation.

## 2022-04-24 NOTE — Assessment & Plan Note (Signed)
Health maintenance reviewed and updated. Discussed nutrition, exercise. Check CMP, CBC, TSH today. Follow-up 1 year.   

## 2022-04-25 LAB — HEPATITIS C ANTIBODY: Hepatitis C Ab: NONREACTIVE

## 2022-04-25 LAB — CERVICOVAGINAL ANCILLARY ONLY
Bacterial Vaginitis (gardnerella): NEGATIVE
Candida Glabrata: POSITIVE — AB
Candida Vaginitis: NEGATIVE
Comment: NEGATIVE
Comment: NEGATIVE
Comment: NEGATIVE

## 2022-04-25 MED ORDER — FLUCONAZOLE 150 MG PO TABS: ORAL_TABLET | ORAL | 0 refills | Status: DC

## 2022-04-25 NOTE — Addendum Note (Signed)
Addended by: Vance Peper A on: 04/25/2022 04:46 PM   Modules accepted: Orders

## 2022-04-30 LAB — CYTOLOGY - PAP
Adequacy: ABSENT
Diagnosis: NEGATIVE

## 2022-05-05 ENCOUNTER — Telehealth: Payer: Self-pay | Admitting: Nurse Practitioner

## 2022-05-05 NOTE — Telephone Encounter (Signed)
Pt called back stating she missed call. Advised pt this was to discuss lab results. Pt notes she saw them online. Nothing further needed. Pt declined to schedule next visit at this time and said she will call back when she is ready to schedule.

## 2022-05-05 NOTE — Telephone Encounter (Signed)
Noted  

## 2022-10-22 ENCOUNTER — Other Ambulatory Visit: Payer: Self-pay | Admitting: Nurse Practitioner

## 2022-10-22 ENCOUNTER — Telehealth: Payer: Self-pay | Admitting: Nurse Practitioner

## 2022-10-22 NOTE — Telephone Encounter (Signed)
I tried to call patient and was disconnected. Patient needs an office visit.

## 2022-10-22 NOTE — Telephone Encounter (Signed)
Prescription Request  10/22/2022  LOV: 04/24/2022  What is the name of the medication or equipment? luconazole (DIFLUCAN) 150 MG tablet [[id number]]   Have you contacted your pharmacy to request a refill? No   Which pharmacy would you like this sent to?    [facility] [address] [place], [place] - [address] [place] [address] Phone: [phone] Fax: [phone]   Patient notified that their request is being sent to the clinical staff for review and that they should receive a response within 2 business days.   Please advise at Mobile [phone] (mobile)

## 2022-10-22 NOTE — Telephone Encounter (Signed)
10/22/22 - Pt called and was informed she needed an OV.

## 2022-10-23 ENCOUNTER — Other Ambulatory Visit: Payer: Self-pay | Admitting: Nurse Practitioner

## 2022-10-23 ENCOUNTER — Telehealth: Payer: Self-pay | Admitting: Nurse Practitioner

## 2022-10-23 MED ORDER — NORETHIN ACE-ETH ESTRAD-FE 1-20 MG-MCG PO TABS: 1.0000 | ORAL_TABLET | Freq: Every day | ORAL | 3 refills | Status: DC

## 2022-10-23 NOTE — Telephone Encounter (Signed)
Prescription Request  10/23/2022  LOV: 04/24/2022  What is the name of the medication or equipment? norethindrone-ethinyl estradiol (LOESTRIN) 1-20 MG-MCG tablet [[id number]]   Have you contacted your pharmacy to request a refill? No   Which pharmacy would you like this sent to?    [facility] [address] [place], [place] - [address] [place] [address] Phone: [phone] Fax: [phone]  Patient notified that their request is being sent to the clinical staff for review and that they should receive a response within 2 business days.   Please advise at Mobile [phone] (mobile)

## 2022-10-23 NOTE — Telephone Encounter (Signed)
Requesting: norethindrone-ethinyl estradiol (LOESTRIN) 1-20 MG-MCG tablet  Last Visit: 04/24/2022 Next Visit: Visit date not found Last Refill: 09/23/2021  Please Advise

## 2022-10-23 NOTE — Telephone Encounter (Signed)
LVM that Rx refilled and sent to pharmacy.

## 2023-06-09 ENCOUNTER — Telehealth: Payer: Self-pay

## 2023-06-09 MED ORDER — NORETHIN ACE-ETH ESTRAD-FE 1-20 MG-MCG PO TABS: 1.0000 | ORAL_TABLET | Freq: Every day | ORAL | 0 refills | Status: DC

## 2023-06-09 NOTE — Telephone Encounter (Signed)
 Requesting: Larin FE TAB Last Visit: 04/24/2022 Next Visit: 06/30/2023 with Kathrene Parents, NP Last Refill: 10/23/2022  Please Advise

## 2023-06-30 ENCOUNTER — Telehealth: Payer: Self-pay | Admitting: Nurse Practitioner

## 2023-06-30 ENCOUNTER — Encounter: Admitting: Nurse Practitioner

## 2023-06-30 NOTE — Telephone Encounter (Signed)
 Copied from CRM [id number]. Topic: General - Other >> Jun 30, 2023  9:15 AM Melissa C wrote: Reason for CRM: patient had to cancel today's transfer of care appointment due to car problems which she greatly apologizes for. However, since appointment was a transfer of care I am unable to get her rescheduled. I let her know I would contact the office for rescheduling. Please advise.

## 2023-07-01 NOTE — Telephone Encounter (Signed)
 We will allow 1x reschedule. Please make pt aware.

## 2023-07-03 NOTE — Telephone Encounter (Signed)
 Called pt LVM that she can schedule 1 more time.

## 2023-08-01 ENCOUNTER — Other Ambulatory Visit: Payer: Self-pay | Admitting: Nurse Practitioner

## 2023-08-03 ENCOUNTER — Other Ambulatory Visit: Payer: Self-pay | Admitting: Nurse Practitioner

## 2023-08-03 NOTE — Telephone Encounter (Signed)
 Requesting: LARIN FE 1/20 TAB 1MG -20MCG  Last Visit: 04/24/2022 Next Visit: No FOV Last Refill: 06/09/2023 canceled appt on 06/30/23  Please Advise

## 2023-08-03 NOTE — Telephone Encounter (Signed)
 Requesting: norethindrone -ethinyl estradiol-FE (LOESTRIN FE 1/20) 1-20 MG-MCG tablet  Last Visit: 04/24/2022 Next Visit: Visit date not found Last Refill: 06/09/2023 appointment on 06/30/23 canceled  Please Advise

## 2023-08-03 NOTE — Telephone Encounter (Signed)
 Copied from CRM [id number]. Topic: Clinical - Medication Refill >> Aug 03, 2023  8:20 AM Ernestene P wrote: Medication: norethindrone -ethinyl estradiol-FE (LOESTRIN FE 1/20) 1-20 MG-MCG tablet  Has the patient contacted their pharmacy? Yes (Agent: If no, request that the patient contact the pharmacy for the refill. If patient does not wish to contact the pharmacy document the reason why and proceed with request.) (Agent: If yes, when and what did the pharmacy advise?)  This is the patient's preferred pharmacy:   [facility] - [place], [place] - [address] [place] [address] Phone: [phone] Fax: [phone]  Is this the correct pharmacy for this prescription? Yes If no, delete pharmacy and type the correct one.   Has the prescription been filled recently? No  Is the patient out of the medication? Yes  Has the patient been seen for an appointment in the last year OR does the patient have an upcoming appointment? Yes  Can we respond through MyChart? Yes  Agent: Please be advised that Rx refills may take up to 3 business days. We ask that you follow-up with your pharmacy.

## 2023-08-05 NOTE — Telephone Encounter (Signed)
 Copied from CRM [id number]. Topic: Clinical - Medication Question >> Aug 05, 2023  9:52 AM Tiffany Brewer wrote: Reason for CRM: Patient is calling to see if her norethindrone -ethinyl estradiol-FE (LOESTRIN FE 1/20) 1-20 MG-MCG tablet  Can be sent to the pharmacy since she has made an appointment on 7/14

## 2023-08-06 MED ORDER — NORETHIN ACE-ETH ESTRAD-FE 1-20 MG-MCG PO TABS: 1.0000 | ORAL_TABLET | Freq: Every day | ORAL | 0 refills | Status: DC

## 2023-08-06 NOTE — Addendum Note (Signed)
 Addended by: Chalisa Kobler A on: 08/06/2023 10:33 AM   Modules accepted: Orders

## 2023-08-24 ENCOUNTER — Encounter: Payer: Self-pay | Admitting: Nurse Practitioner

## 2023-08-24 ENCOUNTER — Ambulatory Visit (INDEPENDENT_AMBULATORY_CARE_PROVIDER_SITE_OTHER): Admitting: Nurse Practitioner

## 2023-08-24 ENCOUNTER — Other Ambulatory Visit (HOSPITAL_COMMUNITY)
Admission: RE | Admit: 2023-08-24 | Discharge: 2023-08-24 | Disposition: A | Discharge status: Home / Self Care | Source: Ambulatory Visit | Attending: Nurse Practitioner | Admitting: Nurse Practitioner

## 2023-08-24 VITALS — BP 124/80 | HR 77 | Temp 97.0°F | Ht 65.0 in | Wt 165.8 lb

## 2023-08-24 DIAGNOSIS — R61 Generalized hyperhidrosis: Secondary | ICD-10-CM | POA: Diagnosis not present

## 2023-08-24 DIAGNOSIS — R7303 Prediabetes: Secondary | ICD-10-CM | POA: Diagnosis not present

## 2023-08-24 DIAGNOSIS — N898 Other specified noninflammatory disorders of vagina: Secondary | ICD-10-CM | POA: Diagnosis present

## 2023-08-24 DIAGNOSIS — Z23 Encounter for immunization: Secondary | ICD-10-CM | POA: Insufficient documentation

## 2023-08-24 DIAGNOSIS — Z0000 Encounter for general adult medical examination without abnormal findings: Secondary | ICD-10-CM

## 2023-08-24 DIAGNOSIS — N926 Irregular menstruation, unspecified: Secondary | ICD-10-CM | POA: Diagnosis not present

## 2023-08-24 DIAGNOSIS — R1011 Right upper quadrant pain: Secondary | ICD-10-CM | POA: Insufficient documentation

## 2023-08-24 DIAGNOSIS — Z114 Encounter for screening for human immunodeficiency virus [HIV]: Secondary | ICD-10-CM

## 2023-08-24 NOTE — Assessment & Plan Note (Signed)
 She has chronic constipation with right-sided discomfort since May. Will check U/S RUQ. Check CMP, CBC, lipase today. Continue increasing fiber and take miralax daily as needed. Follow-up based on results.

## 2023-08-24 NOTE — Patient Instructions (Signed)
 It was great to see you!  We are checking your labs today and will let you know the results via mychart/phone.   I have ordered an ultrasound of your abdomen - they will call to schedule   Otherwise increase your fiber and take miralax as needed   Let's follow-up in 1 year, sooner if you have concerns.  If a referral was placed today, you will be contacted for an appointment. Please note that routine referrals can sometimes take up to 3-4 weeks to process. Please call our office if you haven't heard anything after this time frame.  Take care,  Tinnie Harada, NP

## 2023-08-24 NOTE — Assessment & Plan Note (Signed)
Chronic, stable.  Will check A1c today and treat based on results.

## 2023-08-24 NOTE — Progress Notes (Signed)
 BP 124/80 (BP Location: Left Arm, Patient Position: Sitting, Cuff Size: Normal)   Pulse 77   Temp (!) 97 F (36.1 C)   Ht 5' 5 (1.651 m)   Wt 165 lb 12.8 oz (75.2 kg)   LMP 07/14/2023 (Approximate)   SpO2 98%   BMI 27.59 kg/m    Subjective:    Patient ID: Tiffany Brewer, female    DOB: January 26, 1996, 28 y.o.   MRN: [id number]  CC: Chief Complaint  Patient presents with   Annual Exam    With lab work-patient is not fasting, concerns with blood sugar and constipation, right abdominal discomfort, pregnancy test, STD screening    HPI: Tiffany Brewer is a 28 y.o. female presenting on 08/24/2023 for comprehensive medical examination. Current medical complaints include:irregular period, RUQ discomfort  Discussed the use of AI scribe software for clinical note transcription with the patient, who gave verbal consent to proceed.  History of Present Illness   Tiffany Brewer is a 28 year old female who presents for an annual physical exam and requests STD and pregnancy testing.  She would like a pregnancy and STD testing today. Her last menstrual period was at the beginning of June, with irregular cycles due to birth control pills. A recent home urine pregnancy test was negative, but she seeks confirmation.  She experiences chronic constipation with discomfort on the right side of her abdomen, beginning in May after a three-day period without bowel movement. She uses a tea with fiber in it to help with her symptoms.   She experiences night sweats without fever and 'pins and needles' sensation in her foot. She is concerned about her blood sugar levels due to past gestational diabetes and a slightly elevated A1c of 6.1%. She has lost ten pounds despite increased food intake.  She has discharge without odor or itching, possibly related to her menstrual cycle. She does have a history of BV.      Depression and Anxiety Screen done today and results listed below:     08/24/2023    3:05 PM 04/24/2022     8:32 AM 05/02/2021    9:39 AM  Depression screen PHQ 2/9  Decreased Interest 0 0 0  Down, Depressed, Hopeless 0 0 0  PHQ - 2 Score 0 0 0  Altered sleeping 0 0 0  Tired, decreased energy 0 0 0  Change in appetite 0 0 0  Feeling bad or failure about yourself  0 0 0  Trouble concentrating 0 0 0  Moving slowly or fidgety/restless 0 0 0  Suicidal thoughts 0 0 0  PHQ-9 Score 0 0 0  Difficult doing work/chores Not difficult at all Not difficult at all Not difficult at all      08/24/2023    3:05 PM 04/24/2022    8:33 AM 05/02/2021    9:41 AM  GAD 7 : Generalized Anxiety Score  Nervous, Anxious, on Edge 0 0 0  Control/stop worrying 0 0 0  Worry too much - different things 0 0 0  Trouble relaxing 0 0 0  Restless 0 0 0  Easily annoyed or irritable 2 0 0  Afraid - awful might happen 0 0 0  Total GAD 7 Score 2 0 0  Anxiety Difficulty Not difficult at all Not difficult at all Not difficult at all    The patient does not have a history of falls. I did not complete a risk assessment for falls. A plan of care for falls was not  documented.   Past Medical History:  Past Medical History:  Diagnosis Date   Bipolar disorder (HCC)    Gestational diabetes    OCD (obsessive compulsive disorder)     Surgical History:  Past Surgical History:  Procedure Laterality Date   INDUCED ABORTION      Medications:  Current Outpatient Medications on File Prior to Visit  Medication Sig   norethindrone -ethinyl estradiol-FE (LOESTRIN FE 1/20) 1-20 MG-MCG tablet Take 1 tablet by mouth daily.   No current facility-administered medications on file prior to visit.    Allergies:  Allergies  Allergen Reactions   Codeine     Social History:  Social History   Socioeconomic History   Marital status: Single    Spouse name: Not on file   Number of children: Not on file   Years of education: Not on file   Highest education level: Not on file  Occupational History   Not on file  Tobacco Use    Smoking status: Every Day    Current packs/day: 0.50    Average packs/day: 0.5 packs/day for 10.0 years (5.0 ttl pk-yrs)    Types: Cigarettes   Smokeless tobacco: Never  Vaping Use   Vaping status: Never Used  Substance and Sexual Activity   Alcohol use: Yes    Comment: occasionally   Drug use: Not Currently    Types: Marijuana   Sexual activity: Yes    Birth control/protection: Pill  Other Topics Concern   Not on file  Social History Narrative   Not on file   Social Drivers of Health   Financial Resource Strain: Not on file  Food Insecurity: Not on file  Transportation Needs: Not on file  Physical Activity: Not on file  Stress: Not on file  Social Connections: Unknown (06/25/2021)   Received from [facility]   Social Network    Social Network: Not on file  Intimate Partner Violence: Unknown (05/17/2021)   Received from [facility]   HITS    Physically Hurt: Not on file    Insult or Talk Down To: Not on file    Threaten Physical Harm: Not on file    Scream or Curse: Not on file   Social History   Tobacco Use  Smoking Status Every Day   Current packs/day: 0.50   Average packs/day: 0.5 packs/day for 10.0 years (5.0 ttl pk-yrs)   Types: Cigarettes  Smokeless Tobacco Never   Social History   Substance and Sexual Activity  Alcohol Use Yes   Comment: occasionally    Family History:  Family History  Problem Relation Age of Onset   Cancer Mother        cervical   Hypertension Mother    Heart disease Paternal Grandmother     Past medical history, surgical history, medications, allergies, family history and social history reviewed with patient today and changes made to appropriate areas of the chart.   Review of Systems  Constitutional:  Positive for diaphoresis (at night) and weight loss.  HENT: Negative.    Eyes: Negative.   Respiratory: Negative.    Cardiovascular: Negative.   Gastrointestinal: Negative.   Genitourinary:  Negative for dysuria.        Vaginal discharge, irregular menses  Musculoskeletal: Negative.   Skin: Negative.   Neurological:  Positive for headaches. Negative for dizziness.  Psychiatric/Behavioral: Negative.     All other ROS negative except what is listed above and in the HPI.      Objective:  BP 124/80 (BP Location: Left Arm, Patient Position: Sitting, Cuff Size: Normal)   Pulse 77   Temp (!) 97 F (36.1 C)   Ht 5' 5 (1.651 m)   Wt 165 lb 12.8 oz (75.2 kg)   LMP 07/14/2023 (Approximate)   SpO2 98%   BMI 27.59 kg/m   Wt Readings from Last 3 Encounters:  08/24/23 165 lb 12.8 oz (75.2 kg)  04/24/22 163 lb 9.6 oz (74.2 kg)  03/11/22 167 lb (75.8 kg)    Physical Exam Vitals and nursing note reviewed.  Constitutional:      General: She is not in acute distress.    Appearance: Normal appearance.  HENT:     Head: Normocephalic and atraumatic.     Right Ear: Tympanic membrane, ear canal and external ear normal.     Left Ear: Tympanic membrane, ear canal and external ear normal.     Mouth/Throat:     Mouth: Mucous membranes are moist.     Pharynx: No posterior oropharyngeal erythema.  Eyes:     Conjunctiva/sclera: Conjunctivae normal.  Cardiovascular:     Rate and Rhythm: Normal rate and regular rhythm.     Pulses: Normal pulses.     Heart sounds: Normal heart sounds.  Pulmonary:     Effort: Pulmonary effort is normal.     Breath sounds: Normal breath sounds.  Abdominal:     Palpations: Abdomen is soft.     Tenderness: There is no abdominal tenderness.  Musculoskeletal:        General: Normal range of motion.     Cervical back: Normal range of motion and neck supple.     Right lower leg: No edema.     Left lower leg: No edema.  Lymphadenopathy:     Cervical: No cervical adenopathy.  Skin:    General: Skin is warm and dry.  Neurological:     General: No focal deficit present.     Mental Status: She is alert and oriented to person, place, and time.     Cranial Nerves: No cranial nerve  deficit.     Coordination: Coordination normal.     Gait: Gait normal.  Psychiatric:        Mood and Affect: Mood normal.        Behavior: Behavior normal.        Thought Content: Thought content normal.        Judgment: Judgment normal.     Results for orders placed or performed in visit on 04/24/22  Lipid panel   Collection Time: 05/15/18 12:00 AM  Result Value Ref Range   Triglycerides 106 40 - 160   Cholesterol 206 (A) 0 - 200   HDL 68 35 - 70   LDL Cholesterol 117   Cytology - PAP   Collection Time: 04/24/22  8:20 AM  Result Value Ref Range   Adequacy      Satisfactory for evaluation; transformation zone component ABSENT.   Diagnosis      - Negative for intraepithelial lesion or malignancy (NILM)  Cervicovaginal ancillary only   Collection Time: 04/24/22  8:20 AM  Result Value Ref Range   Bacterial Vaginitis (gardnerella) Negative    Candida Vaginitis Negative    Candida Glabrata Positive (A)    Comment      Normal Reference Range Bacterial Vaginosis - Negative   Comment Normal Reference Range Candida Species - Negative    Comment Normal Reference Range Candida Galbrata - Negative   CBC with  Differential/Platelet   Collection Time: 04/24/22  8:40 AM  Result Value Ref Range   WBC 4.4 4.0 - 10.5 K/uL   RBC 4.63 3.87 - 5.11 Mil/uL   Hemoglobin 13.8 12.0 - 15.0 g/dL   HCT 58.7 63.9 - 53.9 %   MCV 89.0 78.0 - 100.0 fl   MCHC 33.6 30.0 - 36.0 g/dL   RDW 86.6 88.4 - 84.4 %   Platelets 260.0 150.0 - 400.0 K/uL   Neutrophils Relative % 46.1 43.0 - 77.0 %   Lymphocytes Relative 42.7 12.0 - 46.0 %   Monocytes Relative 8.3 3.0 - 12.0 %   Eosinophils Relative 2.3 0.0 - 5.0 %   Basophils Relative 0.6 0.0 - 3.0 %   Neutro Abs 2.0 1.4 - 7.7 K/uL   Lymphs Abs 1.9 0.7 - 4.0 K/uL   Monocytes Absolute 0.4 0.1 - 1.0 K/uL   Eosinophils Absolute 0.1 0.0 - 0.7 K/uL   Basophils Absolute 0.0 0.0 - 0.1 K/uL  Comprehensive metabolic panel   Collection Time: 04/24/22  8:40 AM   Result Value Ref Range   Sodium 137 135 - 145 mEq/L   Potassium 4.2 3.5 - 5.1 mEq/L   Chloride 104 96 - 112 mEq/L   CO2 26 19 - 32 mEq/L   Glucose, Bld 106 (H) 70 - 99 mg/dL   BUN 9 6 - 23 mg/dL   Creatinine, Ser 9.26 0.40 - 1.20 mg/dL   Total Bilirubin 0.6 0.2 - 1.2 mg/dL   Alkaline Phosphatase 31 (L) 39 - 117 U/L   AST 10 0 - 37 U/L   ALT 7 0 - 35 U/L   Total Protein 6.9 6.0 - 8.3 g/dL   Albumin 4.2 3.5 - 5.2 g/dL   GFR 888.53 >39.99 mL/min   Calcium 9.6 8.4 - 10.5 mg/dL  Hemoglobin J8r   Collection Time: 04/24/22  8:40 AM  Result Value Ref Range   Hgb A1c MFr Bld 6.1 4.6 - 6.5 %  TSH   Collection Time: 04/24/22  8:40 AM  Result Value Ref Range   TSH 2.67 0.35 - 5.50 uIU/mL  Hepatitis C antibody   Collection Time: 04/24/22  8:40 AM  Result Value Ref Range   Hepatitis C Ab NON-REACTIVE NON-REACTIVE  Lipid panel   Collection Time: 04/24/22  8:40 AM  Result Value Ref Range   Cholesterol 217 (H) 0 - 200 mg/dL   Triglycerides 24.9 0.0 - 149.0 mg/dL   HDL 41.49 >60.99 mg/dL   VLDL 84.9 0.0 - 59.9 mg/dL   LDL Cholesterol 856 (H) 0 - 99 mg/dL   Total CHOL/HDL Ratio 4    NonHDL 158.02       Assessment & Plan:   Problem List Items Addressed This Visit       Other   Night sweat   History of night sweats, she states that she had a work-up done by her GYN several years ago that was negative.  She has also experienced intentional and unintentional weight loss. Will check TB gold today.       Relevant Orders   QuantiFERON-TB Gold Plus   Prediabetes   Chronic, stable. Will check A1c today and treat based on results.       Relevant Orders   CBC with Differential/Platelet   Comprehensive metabolic panel with GFR   Hemoglobin A1c   TSH   Routine general medical examination at a health care facility - Primary   Health maintenance reviewed and updated. Discussed nutrition, exercise. Follow-up  1 year.        RUQ pain   She has chronic constipation with right-sided  discomfort since May. Will check U/S RUQ. Check CMP, CBC, lipase today. Continue increasing fiber and take miralax daily as needed. Follow-up based on results.       Relevant Orders   US  Abdomen Limited RUQ (LIVER/GB)   Lipase   Other Visit Diagnoses       Irregular periods       She is currently taking birth control, and had a negative home pregnancy test, however would like to verify with a blood test. B HCG ordered today   Relevant Orders   B-HCG Quant     Immunization due       Tdap given today   Relevant Orders   Tdap vaccine greater than or equal to 7yo IM (Completed)     Vaginal discharge       Check swab for BV, yeast, gonorrhea, chlaymdia, trich   Relevant Orders   Cervicovaginal ancillary only     Screening for HIV (human immunodeficiency virus)       Screen HIV today.   Relevant Orders   HIV Antibody (routine testing w rflx)        Follow up plan: Return in about 1 year (around 08/23/2024) for CPE.   LABORATORY TESTING:  - Pap smear: up to date  IMMUNIZATIONS:   - Tdap: Tetanus vaccination status reviewed: Td vaccination indicated and given today. - Influenza: Postponed to flu season - Pneumovax: Not applicable - Prevnar: Not applicable - HPV: Declined - Shingrix vaccine: Not applicable  SCREENING: -Mammogram: Not applicable  - Colonoscopy: Not applicable  - Bone Density: Not applicable   PATIENT COUNSELING:   Advised to take 1 mg of folate supplement per day if capable of pregnancy.   Sexuality: Discussed sexually transmitted diseases, partner selection, use of condoms, avoidance of unintended pregnancy  and contraceptive alternatives.   Advised to avoid cigarette smoking.  I discussed with the patient that most people either abstain from alcohol or drink within safe limits (<=14/week and <=4 drinks/occasion for males, <=7/weeks and <= 3 drinks/occasion for females) and that the risk for alcohol disorders and other health effects rises proportionally  with the number of drinks per week and how often a drinker exceeds daily limits.  Discussed cessation/primary prevention of drug use and availability of treatment for abuse.   Diet: Encouraged to adjust caloric intake to maintain  or achieve ideal body weight, to reduce intake of dietary saturated fat and total fat, to limit sodium intake by avoiding high sodium foods and not adding table salt, and to maintain adequate dietary potassium and calcium preferably from fresh fruits, vegetables, and low-fat dairy products.    stressed the importance of regular exercise  Injury prevention: Discussed safety belts, safety helmets, smoke detector, smoking near bedding or upholstery.   Dental health: Discussed importance of regular tooth brushing, flossing, and dental visits.    NEXT PREVENTATIVE PHYSICAL DUE IN 1 YEAR. Return in about 1 year (around 08/23/2024) for CPE.  Shawnia Vizcarrondo A Arlen Legendre

## 2023-08-24 NOTE — Assessment & Plan Note (Signed)
 History of night sweats, she states that she had a work-up done by her GYN several years ago that was negative.  She has also experienced intentional and unintentional weight loss. Will check TB gold today.

## 2023-08-24 NOTE — Assessment & Plan Note (Signed)
Health maintenance reviewed and updated. Discussed nutrition, exercise. Follow-up 1 year.

## 2023-08-25 LAB — COMPREHENSIVE METABOLIC PANEL WITH GFR
ALT: 10 U/L (ref 0–35)
AST: 14 U/L (ref 0–37)
Albumin: 4.4 g/dL (ref 3.5–5.2)
Alkaline Phosphatase: 30 U/L — ABNORMAL LOW (ref 39–117)
BUN: 12 mg/dL (ref 6–23)
CO2: 27 meq/L (ref 19–32)
Calcium: 9.3 mg/dL (ref 8.4–10.5)
Chloride: 102 meq/L (ref 96–112)
Creatinine, Ser: 0.7 mg/dL (ref 0.40–1.20)
GFR: 116.12 mL/min (ref >60.00)
Glucose, Bld: 102 mg/dL — ABNORMAL HIGH (ref 70–99)
Potassium: 3.8 meq/L (ref 3.5–5.1)
Sodium: 135 meq/L (ref 135–145)
Total Bilirubin: 0.5 mg/dL (ref 0.2–1.2)
Total Protein: 7 g/dL (ref 6.0–8.3)

## 2023-08-25 LAB — CBC WITH DIFFERENTIAL/PLATELET
Basophils Absolute: 0.1 K/uL (ref 0.0–0.1)
Basophils Relative: 1.1 % (ref 0.0–3.0)
Eosinophils Absolute: 0.1 K/uL (ref 0.0–0.7)
Eosinophils Relative: 1.5 % (ref 0.0–5.0)
HCT: 40.2 % (ref 36.0–46.0)
Hemoglobin: 13.3 g/dL (ref 12.0–15.0)
Lymphocytes Relative: 37.7 % (ref 12.0–46.0)
Lymphs Abs: 2.5 K/uL (ref 0.7–4.0)
MCHC: 33.1 g/dL (ref 30.0–36.0)
MCV: 89.5 fl (ref 78.0–100.0)
Monocytes Absolute: 0.4 K/uL (ref 0.1–1.0)
Monocytes Relative: 6.4 % (ref 3.0–12.0)
Neutro Abs: 3.6 K/uL (ref 1.4–7.7)
Neutrophils Relative %: 53.3 % (ref 43.0–77.0)
Platelets: 275 K/uL (ref 150.0–400.0)
RBC: 4.49 Mil/uL (ref 3.87–5.11)
RDW: 13.2 % (ref 11.5–15.5)
WBC: 6.7 K/uL (ref 4.0–10.5)

## 2023-08-25 LAB — HCG, QUANTITATIVE, PREGNANCY: Quantitative HCG: <0.6 m[IU]/mL

## 2023-08-25 LAB — LIPASE: Lipase: 15 U/L (ref 11.0–59.0)

## 2023-08-25 LAB — TSH: TSH: 3.59 u[IU]/mL (ref 0.35–5.50)

## 2023-08-25 LAB — HEMOGLOBIN A1C: Hgb A1c MFr Bld: 6.3 % (ref 4.6–6.5)

## 2023-08-26 ENCOUNTER — Ambulatory Visit: Payer: Self-pay | Admitting: Nurse Practitioner

## 2023-08-26 ENCOUNTER — Encounter: Payer: Self-pay | Admitting: Nurse Practitioner

## 2023-08-26 DIAGNOSIS — B3731 Acute candidiasis of vulva and vagina: Secondary | ICD-10-CM

## 2023-08-26 LAB — CERVICOVAGINAL ANCILLARY ONLY
Bacterial Vaginitis (gardnerella): NEGATIVE
Candida Glabrata: POSITIVE — AB
Candida Vaginitis: NEGATIVE
Chlamydia: NEGATIVE
Comment: NEGATIVE
Comment: NEGATIVE
Comment: NEGATIVE
Comment: NEGATIVE
Comment: NEGATIVE
Comment: NORMAL
Neisseria Gonorrhea: NEGATIVE
Trichomonas: NEGATIVE

## 2023-08-26 LAB — QUANTIFERON-TB GOLD PLUS
Mitogen-NIL: >10 [IU]/mL
NIL: 0.01 [IU]/mL
QuantiFERON-TB Gold Plus: NEGATIVE
TB1-NIL: 0.01 [IU]/mL
TB2-NIL: 0.01 [IU]/mL

## 2023-08-26 LAB — HIV ANTIBODY (ROUTINE TESTING W REFLEX): HIV 1&2 Ab, 4th Generation: NONREACTIVE

## 2023-08-26 MED ORDER — FLUCONAZOLE 150 MG PO TABS: 150.0000 mg | ORAL_TABLET | Freq: Every day | ORAL | 0 refills | Status: AC

## 2023-08-27 MED ORDER — NORETHIN ACE-ETH ESTRAD-FE 1-20 MG-MCG PO TABS: 1.0000 | ORAL_TABLET | Freq: Every day | ORAL | 0 refills | Status: DC

## 2023-08-27 NOTE — Telephone Encounter (Signed)
 Requesting: norethindrone -ethinyl estradiol-FE (LOESTRIN FE 1/20) 1-20 MG-MCG tablet  Last Visit: 08/24/2023 Next Visit: Visit date not found Last Refill: 08/06/2023  Please Advise

## 2023-08-28 ENCOUNTER — Ambulatory Visit
Admission: RE | Admit: 2023-08-28 | Discharge: 2023-08-28 | Disposition: A | Discharge status: Home / Self Care | Source: Ambulatory Visit | Attending: Nurse Practitioner | Admitting: Nurse Practitioner

## 2023-08-28 DIAGNOSIS — R1011 Right upper quadrant pain: Secondary | ICD-10-CM

## 2023-09-02 MED ORDER — NORETHIN ACE-ETH ESTRAD-FE 1-20 MG-MCG PO TABS: 1.0000 | ORAL_TABLET | Freq: Every day | ORAL | 0 refills | Status: DC

## 2023-09-02 NOTE — Addendum Note (Signed)
 Addended by: GLADIS CLAUDENE GRATE Y on: 09/02/2023 11:47 AM   Modules accepted: Orders

## 2023-09-02 NOTE — Telephone Encounter (Signed)
 Medication sent to [facility].

## 2023-09-10 MED ORDER — NORETHIN ACE-ETH ESTRAD-FE 1-20 MG-MCG PO TABS: 1.0000 | ORAL_TABLET | Freq: Every day | ORAL | 0 refills | Status: DC

## 2023-09-10 NOTE — Telephone Encounter (Signed)
 Left message that medication sent to [facility] [address].

## 2023-09-10 NOTE — Telephone Encounter (Signed)
 Copied from CRM [id number]. Topic: Clinical - Medication Question >> Sep 09, 2023  4:40 PM Jayma L wrote: Reason for CRM: patient called in stated she needs her norethindrone -ethinyl estradiol-FE to be sent to the [facility] located at [address] , she had called in for a refill and it was sent to mail order and we sent to the wrong address, now pt is unable to get her birth control. We had it then sent to [facility] and there is a pre auth that is needed there so she sent it ti [facility], please approve this medicine, patient is very upset looking for updates.

## 2023-10-19 ENCOUNTER — Other Ambulatory Visit: Payer: Self-pay | Admitting: Nurse Practitioner

## 2024-02-08 ENCOUNTER — Other Ambulatory Visit: Payer: Self-pay

## 2024-02-08 ENCOUNTER — Emergency Department (HOSPITAL_BASED_OUTPATIENT_CLINIC_OR_DEPARTMENT_OTHER)

## 2024-02-08 ENCOUNTER — Emergency Department (HOSPITAL_BASED_OUTPATIENT_CLINIC_OR_DEPARTMENT_OTHER)
Admission: EM | Admit: 2024-02-08 | Discharge: 2024-02-08 | Discharge status: Against Medical Advice | Attending: Emergency Medicine | Admitting: Emergency Medicine

## 2024-02-08 ENCOUNTER — Encounter (HOSPITAL_BASED_OUTPATIENT_CLINIC_OR_DEPARTMENT_OTHER): Payer: Self-pay

## 2024-02-08 DIAGNOSIS — R1084 Generalized abdominal pain: Secondary | ICD-10-CM | POA: Diagnosis present

## 2024-02-08 DIAGNOSIS — K5900 Constipation, unspecified: Secondary | ICD-10-CM | POA: Insufficient documentation

## 2024-02-08 LAB — URINALYSIS, ROUTINE W REFLEX MICROSCOPIC
Bilirubin Urine: NEGATIVE
Glucose, UA: NEGATIVE mg/dL
Ketones, ur: NEGATIVE mg/dL
Leukocytes,Ua: NEGATIVE
Nitrite: NEGATIVE
Protein, ur: NEGATIVE mg/dL
Specific Gravity, Urine: 1.025 (ref 1.005–1.030)
pH: 7 (ref 5.0–8.0)

## 2024-02-08 LAB — COMPREHENSIVE METABOLIC PANEL WITH GFR
ALT: 14 U/L (ref 0–44)
AST: 21 U/L (ref 15–41)
Albumin: 4.6 g/dL (ref 3.5–5.0)
Alkaline Phosphatase: 49 U/L (ref 38–126)
Anion gap: 12 (ref 5–15)
BUN: 16 mg/dL (ref 6–20)
CO2: 26 mmol/L (ref 22–32)
Calcium: 9.1 mg/dL (ref 8.9–10.3)
Chloride: 99 mmol/L (ref 98–111)
Creatinine, Ser: 0.95 mg/dL (ref 0.44–1.00)
GFR, Estimated: >60 mL/min
Glucose, Bld: 104 mg/dL — ABNORMAL HIGH (ref 70–99)
Potassium: 3.8 mmol/L (ref 3.5–5.1)
Sodium: 137 mmol/L (ref 135–145)
Total Bilirubin: 0.8 mg/dL (ref 0.0–1.2)
Total Protein: 7.3 g/dL (ref 6.5–8.1)

## 2024-02-08 LAB — CBC
HCT: 41.6 % (ref 36.0–46.0)
Hemoglobin: 14.2 g/dL (ref 12.0–15.0)
MCH: 30.1 pg (ref 26.0–34.0)
MCHC: 34.1 g/dL (ref 30.0–36.0)
MCV: 88.1 fL (ref 80.0–100.0)
Platelets: 307 K/uL (ref 150–400)
RBC: 4.72 MIL/uL (ref 3.87–5.11)
RDW: 13.8 % (ref 11.5–15.5)
WBC: 5.5 K/uL (ref 4.0–10.5)
nRBC: 0 % (ref 0.0–0.2)

## 2024-02-08 LAB — URINALYSIS, MICROSCOPIC (REFLEX)

## 2024-02-08 LAB — PREGNANCY, URINE: Preg Test, Ur: NEGATIVE

## 2024-02-08 LAB — LIPASE, BLOOD: Lipase: 12 U/L (ref 11–51)

## 2024-02-08 MED ORDER — IOHEXOL 300 MG/ML  SOLN
75.0000 mL | Freq: Once | INTRAMUSCULAR | Status: AC | PRN
  Administered 2024-02-08: 75 mL via INTRAVENOUS

## 2024-02-08 NOTE — Discharge Instructions (Signed)
 You are leaving prior to the results of your CT scan.  Make sure to follow-up with the results on MyChart.  Return for any worsening symptoms

## 2024-02-08 NOTE — ED Triage Notes (Signed)
 Reports RUQ and pain, constipation since May. 1 episode of emesis 3 days ago.  Denies urinary symptoms, fevers  Last BM this morning

## 2024-02-08 NOTE — ED Notes (Signed)
 Pt has an incredibly rude demeanor, and is extremely disrespectful to staff.

## 2024-02-08 NOTE — ED Notes (Signed)
 Pt snatched the paperwork out of my hand and said oh lovely, this is the fastest y'all have been all night when she had been told repeatedly we are waiting on Radiology interpretation of her CT.   Pt left AMA. Ambulatory with no distress. Obviously in no pain.

## 2024-02-08 NOTE — ED Provider Notes (Signed)
 " [facility] Provider Note   CSN: [id number] Arrival date & time: 02/08/24  1424    Patient presents with: Abdominal Pain and Constipation   Tiffany Brewer is a 28 y.o. female here for evaluation of pain and constipation.  Since this summer, about 6 months ago she has developed some diffuse abdominal pain worse to her right abdomen.  Feels like the consistency of her stools has changed now having thinner in caliber stools.  She is taking MiraLAX, senna, enemas with mild relief.  No fever, nausea, vomiting, bloody stool.  No chronic NSAID use, EtOH use.  Stools are mucousy in consistency.  Her last bowel movement was this morning.  She had an ultrasound from her PCP this summer which did not show any evidence of cholelithiasis, cholecystitis.  Patient thinks her pain is due to constipation.  Family member diagnosed with IBS, no personal or family history of IBD, colon CA at early age.   HPI     Prior to Admission medications  Medication Sig Start Date End Date Taking? Authorizing Provider  fluconazole  (DIFLUCAN ) 150 MG tablet Take 1 tablet (150 mg total) by mouth daily. Take second tab 3days apart from first tab 08/26/23   Katheen Roselie Rockford, NP  LARIN FE 1/20 1-20 MG-MCG tablet TAKE 1 TABLET BY MOUTH DAILY 10/19/23   McElwee, Tinnie LABOR, NP    Allergies: Codeine    Review of Systems  Constitutional: Negative.   HENT: Negative.    Respiratory: Negative.    Cardiovascular: Negative.   Gastrointestinal:  Positive for abdominal pain and constipation. Negative for blood in stool, diarrhea, nausea, rectal pain and vomiting.  Genitourinary: Negative.   Musculoskeletal: Negative.   Skin: Negative.   Neurological: Negative.   All other systems reviewed and are negative.   Updated Vital Signs BP (!) 150/105   Pulse 64   Temp 98.1 F (36.7 C) (Oral)   Resp 16   LMP 02/01/2024   SpO2 99%   Physical Exam Vitals and nursing note reviewed.   Constitutional:      General: She is not in acute distress.    Appearance: She is well-developed. She is not ill-appearing, toxic-appearing or diaphoretic.  HENT:     Head: Normocephalic and atraumatic.     Mouth/Throat:     Mouth: Mucous membranes are moist.  Eyes:     Pupils: Pupils are equal, round, and reactive to light.  Cardiovascular:     Rate and Rhythm: Normal rate.     Heart sounds: Normal heart sounds.  Pulmonary:     Effort: Pulmonary effort is normal. No respiratory distress.     Breath sounds: Normal breath sounds.  Abdominal:     General: Bowel sounds are normal. There is no distension.     Palpations: Abdomen is soft.     Tenderness: There is generalized abdominal tenderness. There is no right CVA tenderness, left CVA tenderness, guarding or rebound. Negative signs include Murphy's sign and McBurney's sign.  Musculoskeletal:        General: Normal range of motion.     Cervical back: Normal range of motion.  Skin:    General: Skin is warm and dry.  Neurological:     General: No focal deficit present.     Mental Status: She is alert.  Psychiatric:        Mood and Affect: Mood normal.    (all labs ordered are listed, but only abnormal results are displayed)  Labs Reviewed  COMPREHENSIVE METABOLIC PANEL WITH GFR - Abnormal; Notable for the following components:      Result Value   Glucose, Bld 104 (*)    All other components within normal limits  URINALYSIS, ROUTINE W REFLEX MICROSCOPIC - Abnormal; Notable for the following components:   Hgb urine dipstick TRACE (*)    All other components within normal limits  URINALYSIS, MICROSCOPIC (REFLEX) - Abnormal; Notable for the following components:   Bacteria, UA FEW (*)    All other components within normal limits  LIPASE, BLOOD  CBC  PREGNANCY, URINE    EKG: None  Radiology: CT ABDOMEN PELVIS W CONTRAST Result Date: 02/08/2024 EXAM: CT ABDOMEN AND PELVIS WITH CONTRAST 02/08/2024 06:07:48 PM TECHNIQUE: CT  of the abdomen and pelvis was performed with the administration of 75 mL of iohexol  (OMNIPAQUE ) 300 MG/ML solution. Multiplanar reformatted images are provided for review. Automated exposure control, iterative reconstruction, and/or weight-based adjustment of the mA/kV was utilized to reduce the radiation dose to as low as reasonably achievable. COMPARISON: None available. CLINICAL HISTORY: Abdominal pain, acute, nonlocalized. Right upper quadrant abdominal pain and constipation. FINDINGS: LOWER CHEST: No acute abnormality. LIVER: Focal fatty hepatic infiltration adjacent to the falciform ligament. GALLBLADDER AND BILE DUCTS: Gallbladder is unremarkable. No biliary ductal dilatation. SPLEEN: No acute abnormality. PANCREAS: No acute abnormality. ADRENAL GLANDS: No acute abnormality. KIDNEYS, URETERS AND BLADDER: No stones in the kidneys or ureters. No hydronephrosis. No perinephric or periureteral stranding. Urinary bladder is unremarkable. GI AND BOWEL: Appendix normal. The stomach, small bowel, and large bowel are otherwise unremarkable. Small stool burden. PERITONEUM AND RETROPERITONEUM: No ascites. No free air. VASCULATURE: Aorta is normal in caliber. LYMPH NODES: No lymphadenopathy. REPRODUCTIVE ORGANS: No acute abnormality. BONES AND SOFT TISSUES: No acute osseous abnormality. No focal soft tissue abnormality. IMPRESSION: 1. No acute findings in the abdomen or pelvis. Electronically signed by: Dorethia Molt MD 02/08/2024 08:35 PM EST RP Workstation: [id number]     Procedures   Medications Ordered in the ED  iohexol  (OMNIPAQUE ) 300 MG/ML solution 75 mL (75 mLs Intravenous Contrast Given 02/08/24 3266)    28 year old here for evaluation of abdominal pain and constipation.  Having mucousy stools, change in caliber.  No personal history of IBD, has never had a colonoscopy, followed by GI.  Symptoms over the last 6 months, initially had ultrasound right upper quadrant and most of her pain is at symptom  onset without any evidence of gallstones or infection.  Doing OTC medications without relief.  Will plan on labs, imaging, reassess  Labs and imaging personally viewed and interpreted:  CBC without leukocytosis Metabolic panel without significant abnormality Lipase 12 Pregnancy test negative UA neg for infection   1900: Went to reassess patient. Upset about the wait in the ED. Discussed with patient we are waiting for radiologist to read CT. Patient requesting IV removed and that she wants to get out of here.  States she will follow-up with results on MyChart. Encouraged to FU outpatient, return for new or worsening symptoms.   ADDEND: CT AP wo acute abnormality                                  Medical Decision Making Amount and/or Complexity of Data Reviewed External Data Reviewed: labs, radiology and notes. Labs: ordered. Decision-making details documented in ED Course. Radiology: ordered and independent interpretation performed. Decision-making details documented in ED Course.  Risk OTC drugs.  Prescription drug management. Decision regarding hospitalization. Diagnosis or treatment significantly limited by social determinants of health.      Final diagnoses:  Generalized abdominal pain    ED Discharge Orders     None          Vinay Ertl A, PA-C 02/08/24 2234  "

## 2024-02-09 ENCOUNTER — Encounter: Payer: Self-pay | Admitting: Nurse Practitioner

## 2024-02-09 DIAGNOSIS — K760 Fatty (change of) liver, not elsewhere classified: Secondary | ICD-10-CM

## 2024-02-09 DIAGNOSIS — R1011 Right upper quadrant pain: Secondary | ICD-10-CM

## 2024-02-17 ENCOUNTER — Ambulatory Visit (INDEPENDENT_AMBULATORY_CARE_PROVIDER_SITE_OTHER): Admitting: Nurse Practitioner

## 2024-02-17 ENCOUNTER — Encounter: Payer: Self-pay | Admitting: Nurse Practitioner

## 2024-02-17 VITALS — BP 122/88 | HR 84 | Temp 97.2°F | Ht 65.0 in | Wt 165.4 lb

## 2024-02-17 DIAGNOSIS — K760 Fatty (change of) liver, not elsewhere classified: Secondary | ICD-10-CM | POA: Diagnosis not present

## 2024-02-17 DIAGNOSIS — R200 Anesthesia of skin: Secondary | ICD-10-CM | POA: Diagnosis not present

## 2024-02-17 DIAGNOSIS — R7303 Prediabetes: Secondary | ICD-10-CM

## 2024-02-17 LAB — POCT GLYCOSYLATED HEMOGLOBIN (HGB A1C)
HbA1c POC (<> result, manual entry): 6.1 %
HbA1c, POC (controlled diabetic range): 6.1 % (ref 0.0–7.0)
HbA1c, POC (prediabetic range): 6.1 % (ref 5.7–6.4)
Hemoglobin A1C: 6.1 % — AB (ref 4.0–5.6)

## 2024-02-17 MED ORDER — BLOOD GLUCOSE MONITORING SUPPL DEVI: 1.0000 | Freq: Every day | 0 refills | Status: AC

## 2024-02-17 MED ORDER — LANCET DEVICE MISC: 1.0000 | Freq: Every day | 0 refills | Status: AC

## 2024-02-17 MED ORDER — BLOOD GLUCOSE TEST VI STRP: 1.0000 | ORAL_STRIP | Freq: Every day | 0 refills | Status: AC

## 2024-02-17 MED ORDER — LANCETS MISC: 1.0000 | Freq: Every day | 0 refills | Status: AC

## 2024-02-17 MED ORDER — METFORMIN HCL ER 500 MG PO TB24: 500.0000 mg | ORAL_TABLET | Freq: Every day | ORAL | 0 refills | Status: AC

## 2024-02-17 NOTE — Patient Instructions (Signed)
 It was great to see you!  Start metformin  1 tablet daily with food  Keep trying to limit fried/fatty foods and aim for whole wheat or brown rice   Let's follow-up in 2-3 months, sooner if you have concerns.  If a referral was placed today, you will be contacted for an appointment. Please note that routine referrals can sometimes take up to 3-4 weeks to process. Please call our office if you haven't heard anything after this time frame.  Take care,  Tinnie Harada, NP

## 2024-02-17 NOTE — Assessment & Plan Note (Signed)
 Chronic, ongoing. Her A1c is 6.1%. There is concern about progression to diabetes and risks of kidney failure. Start metformin  XR 500mg , one tablet daily with food. Provide a glucometer for home monitoring. Advise dietary modifications, including switching to brown rice and reducing fried foods. Encourage exercise and weight management.

## 2024-02-17 NOTE — Assessment & Plan Note (Signed)
 Imaging shows focal fatty infiltration. She experiences right upper quadrant discomfort and bloating, worsened by certain foods and alcohol. Continue focus on healthy eating and exercise. Advise moderation in alcohol consumption. Recommend limiting Tylenol use and prefer ibuprofen  for pain.

## 2024-02-17 NOTE — Assessment & Plan Note (Signed)
 She experiences numbness and tingling in the right arm extending to fingers. She thinks it may be related to her sugars. Could be pinched nerve. No red flags on exam. Monitor symptoms and report if persistent or worsening. Follow up in 2-3 months to assess response to metformin  and symptom management.

## 2024-02-17 NOTE — Progress Notes (Signed)
 "  Established Patient Office Visit  Subjective   Patient ID: Tiffany Brewer, female    DOB: 10/13/1995  Age: 29 y.o. MRN: [id number]  Chief Complaint  Patient presents with   Discuss Lab Results    Discuss recent Ed results from 02/08/24, right arm numbness since this morning thinks from prediabetes    HPI  Discussed the use of AI scribe software for clinical note transcription with the patient, who gave verbal consent to proceed.  History of Present Illness   Tiffany Brewer is a 29 year old female who presents with right upper quadrant discomfort and numbness in her right arm.  She describes ongoing right upper quadrant discomfort as uncomfortable rather than painful, with bloating. Prior gallbladder evaluation was normal. A recent ER CT showed focal fatty infiltration of the liver. She denies constipation and diarrhea.   She developed right arm numbness the morning of the visit. The tingling extends from the arm into the fingers without pain. She notes heaviness and intermittent weakness of the arm, worse when driving. She denies recent arm injury or neck pain.  She has gestational diabetes in the past and prediabetes currently. She believes her arm numbness is from her sugars. She has lost weight, avoids fried foods, limits rice, and typically fasts until after noon. There is a family history of diabetes with her grandmother dying from kidney failure due to diabetes. She is concerned about kidney health and preventing diabetes complications.  She drinks alcohol on weekends and smokes cigarettes. Smoking sometimes makes her feel woozy and causes shortness of breath. She denies chest pain.       ROS See pertinent positives and negatives per HPI.    Objective:     BP 122/88 (BP Location: Left Arm, Patient Position: Sitting, Cuff Size: Normal)   Pulse 84   Temp (!) 97.2 F (36.2 C)   Ht 5' 5 (1.651 m)   Wt 165 lb 6.4 oz (75 kg)   LMP 02/01/2024 (Approximate)   SpO2 99%   BMI  27.52 kg/m  BP Readings from Last 3 Encounters:  02/17/24 122/88  02/08/24 (!) 150/105  08/24/23 124/80   Wt Readings from Last 3 Encounters:  02/17/24 165 lb 6.4 oz (75 kg)  08/24/23 165 lb 12.8 oz (75.2 kg)  04/24/22 163 lb 9.6 oz (74.2 kg)    Fibrosis 4 Score = .55  Fib-4 interpretation is not validated for people under 35 or over 72 years of age. However, scores under 2.0 are generally considered low risk.   Physical Exam Vitals and nursing note reviewed.  Constitutional:      General: She is not in acute distress.    Appearance: Normal appearance.  HENT:     Head: Normocephalic.  Eyes:     Conjunctiva/sclera: Conjunctivae normal.  Cardiovascular:     Rate and Rhythm: Normal rate and regular rhythm.     Pulses: Normal pulses.     Heart sounds: Normal heart sounds.  Pulmonary:     Effort: Pulmonary effort is normal.     Breath sounds: Normal breath sounds.  Abdominal:     General: There is no distension.     Palpations: Abdomen is soft.     Tenderness: There is abdominal tenderness (RUQ). There is no guarding.  Musculoskeletal:        General: No swelling or tenderness. Normal range of motion.     Cervical back: Normal range of motion.     Comments: Empty can test negative  Skin:    General: Skin is warm.  Neurological:     General: No focal deficit present.     Mental Status: She is alert and oriented to person, place, and time.  Psychiatric:        Mood and Affect: Mood normal.        Behavior: Behavior normal.        Thought Content: Thought content normal.        Judgment: Judgment normal.       Assessment & Plan:   Problem List Items Addressed This Visit       Digestive   Fatty liver   Imaging shows focal fatty infiltration. She experiences right upper quadrant discomfort and bloating, worsened by certain foods and alcohol. Continue focus on healthy eating and exercise. Advise moderation in alcohol consumption. Recommend limiting Tylenol use and  prefer ibuprofen  for pain.         Other   Prediabetes - Primary   Chronic, ongoing. Her A1c is 6.1%. There is concern about progression to diabetes and risks of kidney failure. Start metformin  XR 500mg , one tablet daily with food. Provide a glucometer for home monitoring. Advise dietary modifications, including switching to brown rice and reducing fried foods. Encourage exercise and weight management.      Relevant Orders   POCT glycosylated hemoglobin (Hb A1C) (Completed)   Right arm numbness   She experiences numbness and tingling in the right arm extending to fingers. She thinks it may be related to her sugars. Could be pinched nerve. No red flags on exam. Monitor symptoms and report if persistent or worsening. Follow up in 2-3 months to assess response to metformin  and symptom management.       Return in about 3 months (around 05/17/2024) for prediabetes .    Tinnie DELENA Harada, NP  "

## 2024-02-25 NOTE — Addendum Note (Signed)
 Addended by: Jamell Laymon A on: 02/25/2024 01:26 PM   Modules accepted: Orders

## 2024-03-14 ENCOUNTER — Other Ambulatory Visit: Payer: Self-pay | Admitting: Nurse Practitioner

## 2024-05-17 ENCOUNTER — Ambulatory Visit: Admitting: Nurse Practitioner
